# Patient Record
Sex: Male | Born: 1970 | Race: Black or African American | Hispanic: No | Smoking: Former smoker
Health system: Southern US, Community
[De-identification: ages and names within clinical notes are randomized; demographics above are authoritative.]

## PROBLEM LIST (undated history)

## (undated) DIAGNOSIS — E669 Obesity, unspecified: Secondary | ICD-10-CM

## (undated) DIAGNOSIS — I2510 Atherosclerotic heart disease of native coronary artery without angina pectoris: Secondary | ICD-10-CM

## (undated) DIAGNOSIS — I8290 Acute embolism and thrombosis of unspecified vein: Secondary | ICD-10-CM

## (undated) DIAGNOSIS — I10 Essential (primary) hypertension: Secondary | ICD-10-CM

## (undated) HISTORY — PX: CARDIAC CATHETERIZATION: SHX172

## (undated) HISTORY — DX: Obesity, unspecified: E66.9

## (undated) HISTORY — DX: Atherosclerotic heart disease of native coronary artery without angina pectoris: I25.10

## (undated) HISTORY — PX: APPENDECTOMY: SHX54

---

## 2009-09-24 ENCOUNTER — Emergency Department (HOSPITAL_BASED_OUTPATIENT_CLINIC_OR_DEPARTMENT_OTHER): Admission: EM | Admit: 2009-09-24 | Discharge: 2009-09-24 | Payer: Self-pay | Admitting: Emergency Medicine

## 2009-09-24 ENCOUNTER — Ambulatory Visit: Payer: Self-pay | Admitting: Diagnostic Radiology

## 2009-10-24 ENCOUNTER — Emergency Department (HOSPITAL_BASED_OUTPATIENT_CLINIC_OR_DEPARTMENT_OTHER): Admission: EM | Admit: 2009-10-24 | Discharge: 2009-10-25 | Payer: Self-pay | Admitting: Emergency Medicine

## 2009-10-24 ENCOUNTER — Ambulatory Visit: Payer: Self-pay | Admitting: Radiology

## 2011-03-28 LAB — URINALYSIS, ROUTINE W REFLEX MICROSCOPIC
Bilirubin Urine: NEGATIVE
Glucose, UA: NEGATIVE mg/dL
Hgb urine dipstick: NEGATIVE
Protein, ur: NEGATIVE mg/dL
Urobilinogen, UA: 1 mg/dL (ref 0.0–1.0)
pH: 5.5 (ref 5.0–8.0)

## 2011-10-29 ENCOUNTER — Emergency Department (HOSPITAL_BASED_OUTPATIENT_CLINIC_OR_DEPARTMENT_OTHER)
Admission: EM | Admit: 2011-10-29 | Discharge: 2011-10-29 | Disposition: A | Discharge status: Home / Self Care | Payer: Managed Care, Other (non HMO) | Attending: Emergency Medicine | Admitting: Emergency Medicine

## 2011-10-29 ENCOUNTER — Encounter: Payer: Self-pay | Admitting: *Deleted

## 2011-10-29 DIAGNOSIS — L299 Pruritus, unspecified: Secondary | ICD-10-CM | POA: Insufficient documentation

## 2011-10-29 NOTE — ED Provider Notes (Signed)
History     CSN: [id number] Arrival date & time: 10/29/2011  6:36 AM   First MD Initiated Contact with Patient 10/29/11 [phone]      Chief Complaint  Patient presents with  . Pruritis    (Consider location/radiation/quality/duration/timing/severity/associated sxs/prior treatment) The history is provided by the patient.  pt c/o itching all over for the past couple years. States has noticed if scratches self or itches a specific area that it will become more itchy. No acute or abrupt change in symptoms today. Denies rash or hives. No throat swelling or tightness. No wheezing. No change in symptoms related to foods, or changes in home or personal products. No recent medication use. No fever or chills. No recent febrile or viral illness. No hx liver or kidney disease.   History reviewed. No pertinent past medical history.  Past Surgical History  Procedure Date  . Cardiac catheterization   . Appendectomy     No family history on file.  History  Substance Use Topics  . Smoking status: Never Smoker   . Smokeless tobacco: Not on file  . Alcohol Use: Yes      Review of Systems  Constitutional: Negative for fever.  HENT: Negative for congestion.   Eyes: Negative for redness and itching.  Respiratory: Negative for shortness of breath.   Cardiovascular: Negative for chest pain and leg swelling.  Gastrointestinal: Negative for vomiting, abdominal pain and diarrhea.  Genitourinary: Negative for dysuria and discharge.  Musculoskeletal: Negative for back pain.  Skin: Negative for rash.  Neurological: Negative for headaches.  Hematological: Does not bruise/bleed easily.    Allergies  Review of patient's allergies indicates no known allergies.  Home Medications  No current outpatient prescriptions on file.  BP 115/84  Pulse 74  Temp(Src) 98.7 F (37.1 C) (Oral)  Ht 5\' 6"  (1.676 m)  Wt 223 lb (101.152 kg)  BMI 35.99 kg/m2  SpO2 99%  Physical Exam  Nursing note and vitals  reviewed. Constitutional: He is oriented to person, place, and time. He appears well-developed and well-nourished. No distress.  HENT:  Head: Atraumatic.  Mouth/Throat: Oropharynx is clear and moist.  Eyes: Pupils are equal, round, and reactive to light. No scleral icterus.  Neck: Neck supple. No tracheal deviation present.  Cardiovascular: Normal rate, regular rhythm and normal heart sounds.  Exam reveals no gallop and no friction rub.   No murmur heard. Pulmonary/Chest: Effort normal and breath sounds normal. No accessory muscle usage. No respiratory distress. He has no wheezes.  Abdominal: Soft. He exhibits no distension. There is no tenderness.       No hsm.   Musculoskeletal: Normal range of motion. He exhibits no edema.  Neurological: He is alert and oriented to person, place, and time.  Skin: Skin is warm and dry. No rash noted. No erythema. No pallor.       No rash or other skin lesions noted. No scabies noted.   Psychiatric: He has a normal mood and affect.    ED Course  Procedures (including critical care time)  Labs Reviewed - No data to display No results found.   No diagnosis found.    MDM  Discussed w pt, recommend trial benadryl as need for symptom relief.  Also discussed importance of primary care follow up. As ongoing symptoms for 1-2 yrs, pt inquires about referral to 'skin specialist' - will give referral to derm.          Suzi Roots, MD 10/29/11 [phone]

## 2011-10-29 NOTE — ED Notes (Signed)
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 Pt does not remember the treatment, and never f/u with a dermatologist.

## 2011-10-29 NOTE — ED Notes (Signed)
Intermittent generalized body itching for the past year. Worsened this past week. States when he scratches, the itching worsens. Also c/o suprapubic pains since last week. Denies urinary symptoms or penile discharge.

## 2013-11-03 ENCOUNTER — Encounter (HOSPITAL_BASED_OUTPATIENT_CLINIC_OR_DEPARTMENT_OTHER): Payer: Self-pay | Admitting: Emergency Medicine

## 2013-11-03 ENCOUNTER — Emergency Department (HOSPITAL_BASED_OUTPATIENT_CLINIC_OR_DEPARTMENT_OTHER)
Admission: EM | Admit: 2013-11-03 | Discharge: 2013-11-03 | Disposition: A | Discharge status: Home / Self Care | Payer: Managed Care, Other (non HMO) | Attending: Emergency Medicine | Admitting: Emergency Medicine

## 2013-11-03 DIAGNOSIS — Z9861 Coronary angioplasty status: Secondary | ICD-10-CM | POA: Insufficient documentation

## 2013-11-03 DIAGNOSIS — L299 Pruritus, unspecified: Secondary | ICD-10-CM | POA: Insufficient documentation

## 2013-11-03 NOTE — ED Notes (Signed)
Pt c/o itching and rash to neck and arms x 2 wks since new job in [employer].

## 2013-11-03 NOTE — ED Provider Notes (Signed)
CSN: [id number]     Arrival date & time 11/03/13  0907 History   First MD Initiated Contact with Patient 11/03/13 0915     Chief Complaint  Patient presents with  . Rash   (Consider location/radiation/quality/duration/timing/severity/associated sxs/prior Treatment) HPI Comments: 42 year old male presents with itching over the past 3 months. This coincided with starting a new job at a [employer] 3 months ago. He states he is similar problem back in 1997 and was advised to stop working there by doctor. 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 Mostly chemical [employer] used to make pain. Patient states she's noticed some redness as a rash mostly has pruritus. There's been no shortness of breath, sore throat, throat scratching, or vomiting. He states occasionally he gets some symptoms in size, most recently 2 weeks ago. He denies any current blurry vision, eye pain, eye itching, or discharge. He called the temp job service asking for new job, and they stated he needed a doctor to see him to verify that the itching is coming from the chemicals.   History reviewed. No pertinent past medical history. Past Surgical History  Procedure Laterality Date  . Cardiac catheterization    . Appendectomy     No family history on file. History  Substance Use Topics  . Smoking status: Never Smoker   . Smokeless tobacco: Not on file  . Alcohol Use: Yes    Review of Systems  Eyes: Negative for photophobia, pain, itching and visual disturbance.  Respiratory: Negative for cough.   All other systems reviewed and are negative.    Allergies  Review of patient's allergies indicates no known allergies.  Home Medications  No current outpatient prescriptions on file. BP 136/80  Pulse 73  Temp(Src) 98 F (36.7 C) (Oral)  Resp 18  SpO2 100% Physical Exam  Nursing note and vitals reviewed. Constitutional: He is oriented to person, place, and time. He  appears well-developed and well-nourished.  HENT:  Head: Normocephalic and atraumatic.  Right Ear: External ear normal.  Left Ear: External ear normal.  Nose: Nose normal.  Eyes: Right eye exhibits no discharge. Left eye exhibits no discharge.  Neck: Neck supple.  Cardiovascular: Normal rate, regular rhythm and normal heart sounds.   Pulmonary/Chest: Effort normal and breath sounds normal. He has no wheezes.  Abdominal: He exhibits no distension.  Neurological: He is alert and oriented to person, place, and time.  Skin: Skin is warm and dry.  Multiple lines of erythema, mostly located on the back are c/w scratching. No evidence of infection. No hives seen    ED Course  Procedures (including critical care time) Labs Review Labs Reviewed - No data to display Imaging Review No results found.  EKG Interpretation   None       MDM   1. Pruritic condition    Given the time correlation, his chemical closure is likely causing him to itch. There is no sign of any infection. There's no respiratory or GI symptoms. Will recommend by mouth Benadryl for symptomatic relief. I believe he will get full relief by taking away the exposure. Recommended that he follow she get a new job as this causing him significant distress.    Audree Camel, MD 11/03/13 [phone]

## 2013-11-17 ENCOUNTER — Emergency Department (HOSPITAL_BASED_OUTPATIENT_CLINIC_OR_DEPARTMENT_OTHER)
Admission: EM | Admit: 2013-11-17 | Discharge: 2013-11-17 | Disposition: A | Discharge status: Home / Self Care | Payer: Managed Care, Other (non HMO) | Attending: Emergency Medicine | Admitting: Emergency Medicine

## 2013-11-17 ENCOUNTER — Encounter (HOSPITAL_BASED_OUTPATIENT_CLINIC_OR_DEPARTMENT_OTHER): Payer: Self-pay | Admitting: Emergency Medicine

## 2013-11-17 DIAGNOSIS — K219 Gastro-esophageal reflux disease without esophagitis: Secondary | ICD-10-CM | POA: Insufficient documentation

## 2013-11-17 DIAGNOSIS — Z79899 Other long term (current) drug therapy: Secondary | ICD-10-CM | POA: Insufficient documentation

## 2013-11-17 DIAGNOSIS — Z9889 Other specified postprocedural states: Secondary | ICD-10-CM | POA: Insufficient documentation

## 2013-11-17 DIAGNOSIS — L600 Ingrowing nail: Secondary | ICD-10-CM | POA: Insufficient documentation

## 2013-11-17 MED ORDER — OMEPRAZOLE 20 MG PO CPDR: 20.0000 mg | DELAYED_RELEASE_CAPSULE | Freq: Two times a day (BID) | ORAL | Status: DC

## 2013-11-17 MED ORDER — CEPHALEXIN 500 MG PO CAPS: 500.0000 mg | ORAL_CAPSULE | Freq: Three times a day (TID) | ORAL | Status: DC

## 2013-11-17 MED ORDER — HYDROCODONE-ACETAMINOPHEN 5-325 MG PO TABS: 2.0000 | ORAL_TABLET | ORAL | Status: DC | PRN

## 2013-11-17 NOTE — ED Provider Notes (Addendum)
CSN: [id number]     Arrival date & time 11/17/13  4098 History   First MD Initiated Contact with Patient 11/17/13 [phone]     Chief Complaint  Patient presents with  . Gastrophageal Reflux  . Toe Pain   HPI  Patient presents to 2 complaints one is a sore right great toe.  Exam is an ingrown nail. Second complaint is that he relates that he feels like something is up in his chest or his stomach 3 days of chills or taste in his mouth. He is a feeling that food is tight when he swallows over the last several days as well. No fevers. No weight loss. No night sweats. No regurgitation. Normal bowel movements without blood.  History reviewed. No pertinent past medical history. Past Surgical History  Procedure Laterality Date  . Cardiac catheterization    . Appendectomy     No family history on file. History  Substance Use Topics  . Smoking status: Never Smoker   . Smokeless tobacco: Not on file  . Alcohol Use: Yes    Review of Systems  Constitutional: Negative for fever, chills, diaphoresis, appetite change and fatigue.  HENT: Positive for sore throat. Negative for mouth sores and trouble swallowing.   Eyes: Negative for visual disturbance.  Respiratory: Negative for cough, chest tightness, shortness of breath and wheezing.   Cardiovascular: Positive for chest pain.  Gastrointestinal: Negative for nausea, vomiting, abdominal pain, diarrhea and abdominal distention.  Endocrine: Negative for polydipsia, polyphagia and polyuria.  Genitourinary: Negative for dysuria, frequency and hematuria.  Musculoskeletal: Negative for gait problem.       Ingrown toenail  Skin: Negative for color change, pallor and rash.  Neurological: Negative for dizziness, syncope, light-headedness and headaches.  Hematological: Does not bruise/bleed easily.  Psychiatric/Behavioral: Negative for behavioral problems and confusion.    Allergies  Review of patient's allergies indicates no known allergies.  Home  Medications   Current Outpatient Rx  Name  Route  Sig  Dispense  Refill  . cephALEXin (KEFLEX) 500 MG capsule   Oral   Take 1 capsule (500 mg total) by mouth 3 (three) times daily.   21 capsule   0   . HYDROcodone-acetaminophen (NORCO/VICODIN) 5-325 MG per tablet   Oral   Take 2 tablets by mouth every 4 (four) hours as needed.   10 tablet   0   . omeprazole (PRILOSEC) 20 MG capsule   Oral   Take 1 capsule (20 mg total) by mouth 2 (two) times daily.   60 capsule   0    BP 145/82  Pulse 82  Temp(Src) 97.9 F (36.6 C) (Oral)  SpO2 98% Physical Exam  Constitutional: He is oriented to person, place, and time. He appears well-developed and well-nourished. No distress.  HENT:  Head: Normocephalic.  Normal exam. Oropharynx benign. No adenopathy in the neck.  Eyes: Conjunctivae are normal. Pupils are equal, round, and reactive to light. No scleral icterus.  Neck: Normal range of motion. Neck supple. No thyromegaly present.  Cardiovascular: Normal rate and regular rhythm.  Exam reveals no gallop and no friction rub.   No murmur heard. Pulmonary/Chest: Effort normal and breath sounds normal. No respiratory distress. He has no wheezes. He has no rales.  Abdominal: Soft. Bowel sounds are normal. He exhibits no distension. There is no tenderness. There is no rebound.  Musculoskeletal: Normal range of motion.  Neurological: He is alert and oriented to person, place, and time.  Skin: Skin is warm and  dry. No rash noted.  Right great toe medially shows a minimally somewhat ingrown. Is not fluctuant. Does not appear infected. We discussed removal and he is adamantly opposed  Psychiatric: He has a normal mood and affect. His behavior is normal.    ED Course  Procedures (including critical care time) Labs Review Labs Reviewed - No data to display Imaging Review No results found.  EKG Interpretation   None       MDM   1. GERD (gastroesophageal reflux disease)   2. Ingrown  toenail without infection     Great toe has an ingrown nail. It is not fluctuant or obviously infected. We discussed soaking elevating the nail versus excision of the nail. He is adamant he does not want the nail excised. He will soak the toe. He will jelly manipulate the nail. No straight not on a curve. Prescription for Keflex or try to prevent antibiotics.  His other symptoms are quite classic for reflux esophagitis. His normal exam. The proton pump inhibitors, avoid alcohol tobacco caffeine anti-inflammatories reflux precautions given  Roney Marion, MD 11/17/13 4098  Roney Marion, MD 11/17/13 [phone]

## 2013-11-17 NOTE — ED Notes (Signed)
Pt c/o "throat burning" when lying down, difficulty swallowing food at times x "over a week". Pt denies h/o same. Pt also c/o right great toe pain.

## 2013-11-27 ENCOUNTER — Emergency Department (HOSPITAL_BASED_OUTPATIENT_CLINIC_OR_DEPARTMENT_OTHER)
Admission: EM | Admit: 2013-11-27 | Discharge: 2013-11-27 | Disposition: A | Discharge status: Home / Self Care | Payer: Managed Care, Other (non HMO) | Attending: Emergency Medicine | Admitting: Emergency Medicine

## 2013-11-27 ENCOUNTER — Encounter (HOSPITAL_BASED_OUTPATIENT_CLINIC_OR_DEPARTMENT_OTHER): Payer: Self-pay | Admitting: Emergency Medicine

## 2013-11-27 DIAGNOSIS — Z79899 Other long term (current) drug therapy: Secondary | ICD-10-CM | POA: Insufficient documentation

## 2013-11-27 DIAGNOSIS — Z9861 Coronary angioplasty status: Secondary | ICD-10-CM | POA: Insufficient documentation

## 2013-11-27 DIAGNOSIS — B029 Zoster without complications: Secondary | ICD-10-CM | POA: Insufficient documentation

## 2013-11-27 DIAGNOSIS — Z792 Long term (current) use of antibiotics: Secondary | ICD-10-CM | POA: Insufficient documentation

## 2013-11-27 MED ORDER — OXYCODONE-ACETAMINOPHEN 5-325 MG PO TABS: 1.0000 | ORAL_TABLET | ORAL | Status: DC | PRN

## 2013-11-27 MED ORDER — VALACYCLOVIR HCL 1 G PO TABS: 1000.0000 mg | ORAL_TABLET | Freq: Three times a day (TID) | ORAL | Status: DC

## 2013-11-27 NOTE — ED Notes (Cosign Needed Addendum)
Ha to top of head onset 4 days ago,  Denies inj,  No relief from ibu  Pt states top of head painful to touch and feels raw

## 2013-11-27 NOTE — ED Notes (Signed)
Pt reports headachje x 4 days top of head feels raw and painful to touch Aleve ineffective

## 2013-11-27 NOTE — ED Provider Notes (Signed)
CSN: [id number]     Arrival date & time 11/27/13  1905 History   First MD Initiated Contact with Patient 11/27/13 2019     Chief Complaint  Patient presents with  . Headache   (Consider location/radiation/quality/duration/timing/severity/associated sxs/prior Treatment) Patient is a 42 y.o. male presenting with headaches. The history is provided by the patient.  Headache Pain location:  Occipital Quality:  Sharp Radiates to: to the right temporal area. Severity currently:  8/10 Duration:  4 days Timing:  Constant Progression:  Worsening Chronicity:  New Similar to prior headaches: no   Relieved by:  Nothing Worsened by:  Nothing tried Ineffective treatments:  NSAIDs  Rizwan Kuyper is a 42 y.o. male who presents to the ED with pain on the right side of his head that he describes as burning and severe. He states that pain is so bad that he can't stand for his shirt to touch his head when he pulls it over his head. Has never had pain like this before. The pain started on the side of his head and now radiates to the temporal area. He has taken ibuprofen for pain but no relief. He denies any other problems.   History reviewed. No pertinent past medical history. Past Surgical History  Procedure Laterality Date  . Cardiac catheterization    . Appendectomy     History reviewed. No pertinent family history. History  Substance Use Topics  . Smoking status: Never Smoker   . Smokeless tobacco: Not on file  . Alcohol Use: Yes    Review of Systems  As stated IN HPI  Allergies  Review of patient's allergies indicates no known allergies.  Home Medications   Current Outpatient Rx  Name  Route  Sig  Dispense  Refill  . cephALEXin (KEFLEX) 500 MG capsule   Oral   Take 1 capsule (500 mg total) by mouth 3 (three) times daily.   21 capsule   0   . HYDROcodone-acetaminophen (NORCO/VICODIN) 5-325 MG per tablet   Oral   Take 2 tablets by mouth every 4 (four) hours as needed.   10  tablet   0   . omeprazole (PRILOSEC) 20 MG capsule   Oral   Take 1 capsule (20 mg total) by mouth 2 (two) times daily.   60 capsule   0    BP 140/89  Pulse 87  Temp(Src) 98.4 F (36.9 C) (Oral)  Resp 18  Ht 5\' 6"  (1.676 m)  Wt 245 lb (111.131 kg)  BMI 39.56 kg/m2  SpO2 98% Physical Exam  Nursing note and vitals reviewed. Constitutional: He is oriented to person, place, and time. He appears well-developed and well-nourished.  HENT:  Head: Atraumatic.    Mouth/Throat: Uvula is midline, oropharynx is clear and moist and mucous membranes are normal.  Tender with palpation of right side of head with radiation to the right temporal area.   Eyes: EOM are normal.  Neck: Neck supple.  Cardiovascular: Normal rate and regular rhythm.   Pulmonary/Chest: Effort normal and breath sounds normal.  Abdominal: Soft. There is no tenderness.  Musculoskeletal: Normal range of motion.  Neurological: He is alert and oriented to person, place, and time. No cranial nerve deficit.  Skin: Skin is warm and dry.  Psychiatric: He has a normal mood and affect. His behavior is normal.    ED Course: Dr. Fayrene Fearing in to examine the patient. Will treat for zoster.  Procedures    MDM  42 y.o. male with pain over the  right side of the scalp radiating to the right temporal area. Will treat for zoster. Patient stable for discharge without any immediate complications. Discussed with the patient clinical findings and plan of care. All questioned fully answered. He will returne if any problems arise.    Medication List    TAKE these medications       oxyCODONE-acetaminophen 5-325 MG per tablet  Commonly known as:  ROXICET  Take 1 tablet by mouth every 4 (four) hours as needed for severe pain.     valACYclovir 1000 MG tablet  Commonly known as:  VALTREX  Take 1 tablet (1,000 mg total) by mouth 3 (three) times daily.      ASK your doctor about these medications       cephALEXin 500 MG capsule  Commonly  known as:  KEFLEX  Take 1 capsule (500 mg total) by mouth 3 (three) times daily.     HYDROcodone-acetaminophen 5-325 MG per tablet  Commonly known as:  NORCO/VICODIN  Take 2 tablets by mouth every 4 (four) hours as needed.     omeprazole 20 MG capsule  Commonly known as:  PRILOSEC  Take 1 capsule (20 mg total) by mouth 2 (two) times daily.           [facility] Orlene Och, [place] 11/28/13 [phone]

## 2013-12-06 NOTE — ED Provider Notes (Signed)
Medical screening examination/treatment/procedure(s) were performed by non-physician practitioner and as supervising physician I was immediately available for consultation/collaboration.  EKG Interpretation   None         Evonte Prestage J Naama Sappington, MD 12/06/13 2313 

## 2014-07-25 ENCOUNTER — Encounter (HOSPITAL_BASED_OUTPATIENT_CLINIC_OR_DEPARTMENT_OTHER): Payer: Self-pay | Admitting: Emergency Medicine

## 2014-07-25 ENCOUNTER — Emergency Department (HOSPITAL_BASED_OUTPATIENT_CLINIC_OR_DEPARTMENT_OTHER)
Admission: EM | Admit: 2014-07-25 | Discharge: 2014-07-25 | Disposition: A | Discharge status: Home / Self Care | Payer: Managed Care, Other (non HMO) | Attending: Emergency Medicine | Admitting: Emergency Medicine

## 2014-07-25 ENCOUNTER — Emergency Department (HOSPITAL_BASED_OUTPATIENT_CLINIC_OR_DEPARTMENT_OTHER): Payer: Managed Care, Other (non HMO)

## 2014-07-25 DIAGNOSIS — Z79899 Other long term (current) drug therapy: Secondary | ICD-10-CM | POA: Insufficient documentation

## 2014-07-25 DIAGNOSIS — M7989 Other specified soft tissue disorders: Secondary | ICD-10-CM | POA: Insufficient documentation

## 2014-07-25 DIAGNOSIS — Z86718 Personal history of other venous thrombosis and embolism: Secondary | ICD-10-CM | POA: Insufficient documentation

## 2014-07-25 HISTORY — DX: Acute embolism and thrombosis of unspecified vein: I82.90

## 2014-07-25 NOTE — ED Notes (Signed)
Patient transported to Ultrasound 

## 2014-07-25 NOTE — ED Notes (Signed)
Recent ly told had a c;ot in  Left side of neck anf rt arm.  Was put on Xarelto    Still has swelling of hands  Esp abdominal tenderness night and is concerned he might still have a clot

## 2014-07-25 NOTE — ED Provider Notes (Signed)
This chart was scribed for Layla Maw Ward, DO by Modena Jansky, ED Scribe. This patient was seen in room MH05/MH05 and the patient's care was started at 5:02 PM.  TIME SEEN: 5:02 PM  CHIEF COMPLAINT: Hand Swelling  HPI:  Danny Espinoza is a 43 y.o. 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 He reports that he went to the ED 2 months ago and was told he has a blood clot and was put on Xarelto. He states that his swelling has been getting worse lately to the point where he can't bend his fingers. He denies being on dialysis or recent IVs or other procedures to his upper extremities or neck. He denies any persistent chest pain, SOB, or leg swelling. States he is concerned because the swelling is getting worse instead of better. No history of injury. No numbness, tingling or focal weakness.   ROS: See HPI Constitutional: no fever  Eyes: no drainage  ENT: no runny nose   Cardiovascular:  no chest pain  Resp: no SOB  GI: no vomiting GU: no dysuria Integumentary: no rash  Allergy: no hives  Musculoskeletal: no leg swelling  Neurological: no slurred speech ROS otherwise negative  PAST MEDICAL HISTORY/PAST SURGICAL HISTORY:  Past Medical History  Diagnosis Date  . Thrombosis     MEDICATIONS:  Prior to Admission medications   Medication Sig Start Date End Date Taking? Authorizing Provider  cephALEXin (KEFLEX) 500 MG capsule Take 1 capsule (500 mg total) by mouth 3 (three) times daily. 11/17/13   Rolland Porter, MD  HYDROcodone-acetaminophen (NORCO/VICODIN) 5-325 MG per tablet Take 2 tablets by mouth every 4 (four) hours as needed. 11/17/13   Rolland Porter, MD  omeprazole (PRILOSEC) 20 MG capsule Take 1 capsule (20 mg total) by mouth 2 (two) times daily. 11/17/13   Rolland Porter, MD  oxyCODONE-acetaminophen (ROXICET) 5-325 MG per tablet Take 1  tablet by mouth every 4 (four) hours as needed for severe pain. 11/27/13   Hope Orlene Och, NP  valACYclovir (VALTREX) 1000 MG tablet Take 1 tablet (1,000 mg total) by mouth 3 (three) times daily. 11/27/13   Hope Orlene Och, NP    ALLERGIES:  No Known Allergies  SOCIAL HISTORY:  History  Substance Use Topics  . Smoking status: Never Smoker   . Smokeless tobacco: Not on file  . Alcohol Use: Yes    FAMILY HISTORY: No family history on file.  EXAM: BP 124/83  Pulse 78  Temp(Src) 97.9 F (36.6 C) (Oral)  Resp 18  Ht 5\' 7"  (1.702 m)  Wt 235 lb (106.595 kg)  BMI 36.80 kg/m2  SpO2 100% CONSTITUTIONAL: Alert and oriented and responds appropriately to questions. Well-appearing; well-nourished HEAD: Normocephalic EYES: Conjunctivae clear, PERRL ENT: normal nose; no rhinorrhea; moist mucous membranes; pharynx without lesions noted NECK: Supple, no meningismus, no LAD  CARD: RRR; S1 and S2 appreciated; no murmurs, no clicks, no rubs, no gallops RESP: Normal chest excursion without splinting or tachypnea; breath sounds clear and equal bilaterally; no wheezes, no rhonchi, no rales,  ABD/GI: Normal bowel sounds; non-distended; soft, non-tender, no rebound, no guarding BACK:  The back appears normal and is non-tender to palpation, there is no CVA tenderness EXT: Swelling. +2 DP pulse bilaterally. No appreciable swelling of his upper extremity her hands, sensation to light touch intact diffusely. No bony deformity. No erythema or  warmth. Compartments are soft. Normal ROM in all joints; non-tender to palpation; no edema; normal capillary refill; no cyanosis    SKIN: Normal color for age and race; warm NEURO: Moves all extremities equally PSYCH: The patient's mood and manner are appropriate. Grooming and personal hygiene are appropriate.  MEDICAL DECISION MAKING: Patient have persistent swelling and pain in his upper extremities he states is getting worse instead of better. He has been on Xarelto for  2 months without relief. No other associated symptoms. His exam is benign currently. We'll repeat an ultrasound of his bilateral upper extremities to evaluate for persistent clot. No sign of infection on exam. No history of injury.  ED PROGRESS: Ultrasound shows no sign of DVT. I feel patient is safe to stop taking Xarelto as he is been on it for over 2 months. 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 Discussed elevation, using compression sleeves as needed. Discussed strict return precautions. Patient verbalizes understanding and is comfortable with plan.   I personally performed the services described in this documentation, which was scribed in my presence. The recorded information has been reviewed and is accurate.      Layla MawKristen N Ward, DO 07/25/14 2135

## 2014-07-25 NOTE — Discharge Instructions (Signed)

## 2014-07-25 NOTE — ED Notes (Signed)
Put for ultrasound/xray, this is not for the Secretaries.  Let the Nurse know about it.

## 2014-07-25 NOTE — ED Notes (Signed)
Pt returned from u/s, no needs at this time, warm blanket provided

## 2016-08-14 ENCOUNTER — Ambulatory Visit: Payer: Self-pay | Admitting: Allergy and Immunology

## 2016-10-26 ENCOUNTER — Encounter (HOSPITAL_BASED_OUTPATIENT_CLINIC_OR_DEPARTMENT_OTHER): Payer: Self-pay | Admitting: Emergency Medicine

## 2016-10-26 ENCOUNTER — Emergency Department (HOSPITAL_BASED_OUTPATIENT_CLINIC_OR_DEPARTMENT_OTHER)
Admission: EM | Admit: 2016-10-26 | Discharge: 2016-10-26 | Disposition: A | Discharge status: Home / Self Care | Payer: BLUE CROSS/BLUE SHIELD | Attending: Emergency Medicine | Admitting: Emergency Medicine

## 2016-10-26 DIAGNOSIS — R519 Headache, unspecified: Secondary | ICD-10-CM

## 2016-10-26 DIAGNOSIS — R51 Headache: Secondary | ICD-10-CM | POA: Diagnosis not present

## 2016-10-26 MED ORDER — KETOROLAC TROMETHAMINE 30 MG/ML IJ SOLN
30.0000 mg | Freq: Once | INTRAMUSCULAR | Status: AC
  Administered 2016-10-26: 30 mg via INTRAMUSCULAR
  Filled 2016-10-26: qty 1

## 2016-10-26 NOTE — ED Triage Notes (Signed)
Pt reports HA that started at 2300 last night. Pt denies any n/v. Pt states he has had an URI for a few days.

## 2016-10-26 NOTE — ED Provider Notes (Signed)
   [facility] Provider Note: Danny DellJ. Lane Rafferty Postlewait, MD, FACEP  CSN: [id number] MRN: [id number] ARRIVAL: 10/26/16 at 0330 ROOM: MH06/MH06   CHIEF COMPLAINT  Headache   HISTORY OF PRESENT ILLNESS  Danny Espinoza is a 45 y.o. male who complains of a headache that began yesterday evening about 11 PM. The onset was gradual. It is located frontally. It is throbbing in nature. It is rated as moderate to severe. It is worse with cough. He took Aleve without relief. He denies photophobia, nausea or vomiting. 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 He has had a URI this week characterized primarily as coughing. He denies nasal congestion or rhinorrhea.   Past Medical History:  Diagnosis Date  . Thrombosis     Past Surgical History:  Procedure Laterality Date  . APPENDECTOMY    . CARDIAC CATHETERIZATION      No family history on file.  Social History  Substance Use Topics  . Smoking status: Never Smoker  . Smokeless tobacco: Never Used  . Alcohol use Yes    Prior to Admission medications   Medication Sig Start Date End Date Taking? Authorizing Provider  rivaroxaban (XARELTO) 20 MG TABS tablet Take 20 mg by mouth daily with supper.    Historical Provider, MD    Allergies Review of patient's allergies indicates no known allergies.   REVIEW OF SYSTEMS  Negative except as noted here or in the History of Present Illness.   PHYSICAL EXAMINATION  Initial Vital Signs Blood pressure 137/93, pulse 76, temperature 98 F (36.7 C), temperature source Oral, resp. rate 18, height 5\' 6"  (1.676 m), weight 219 lb (99.3 kg), SpO2 96 %.  Examination General: Well-developed, well-nourished male in no acute distress; appearance consistent with age of record HENT: normocephalic; atraumatic; no fundal tenderness to percussion Eyes: pupils equal, round and reactive to light; extraocular muscles intact Neck: supple Heart: regular rate and  rhythm Lungs: clear to auscultation bilaterally Abdomen: soft; nondistended; nontender; no masses or hepatosplenomegaly; bowel sounds present Extremities: No deformity; full range of motion; pulses normal Neurologic: Awake, alert and oriented; motor function intact in all extremities and symmetric; no facial droop; negative Romberg; normal finger to nose; normal coordination and speech Skin: Warm and dry Psychiatric: Normal mood and affect   RESULTS  Summary of this visit's results, reviewed by myself:   EKG Interpretation  Date/Time:    Ventricular Rate:    PR Interval:    QRS Duration:   QT Interval:    QTC Calculation:   R Axis:     Text Interpretation:        Laboratory Studies: No results found for this or any previous visit (from the past 24 hour(s)). Imaging Studies: No results found.  ED COURSE  Nursing notes and initial vitals signs, including pulse oximetry, reviewed.  Vitals:   10/26/16 0336  BP: 137/93  Pulse: 76  Resp: 18  Temp: 98 F (36.7 C)  TempSrc: Oral  SpO2: 96%  Weight: 219 lb (99.3 kg)  Height: 5\' 6"  (1.676 m)   5:32 AM Pain significantly improved after IM Toradol.  PROCEDURES    ED DIAGNOSES     ICD-9-CM ICD-10-CM   1. Bad headache 784.0 R51        Paula LibraJohn Zoriana Oats, MD 10/26/16 0532

## 2017-06-25 ENCOUNTER — Emergency Department (HOSPITAL_BASED_OUTPATIENT_CLINIC_OR_DEPARTMENT_OTHER)
Admission: EM | Admit: 2017-06-25 | Discharge: 2017-06-25 | Disposition: A | Discharge status: Home / Self Care | Payer: BLUE CROSS/BLUE SHIELD | Attending: Emergency Medicine | Admitting: Emergency Medicine

## 2017-06-25 DIAGNOSIS — Y929 Unspecified place or not applicable: Secondary | ICD-10-CM | POA: Insufficient documentation

## 2017-06-25 DIAGNOSIS — M5432 Sciatica, left side: Secondary | ICD-10-CM

## 2017-06-25 DIAGNOSIS — S39012A Strain of muscle, fascia and tendon of lower back, initial encounter: Secondary | ICD-10-CM | POA: Diagnosis not present

## 2017-06-25 DIAGNOSIS — M549 Dorsalgia, unspecified: Secondary | ICD-10-CM | POA: Diagnosis present

## 2017-06-25 DIAGNOSIS — Y33XXXA Other specified events, undetermined intent, initial encounter: Secondary | ICD-10-CM | POA: Diagnosis not present

## 2017-06-25 DIAGNOSIS — Y999 Unspecified external cause status: Secondary | ICD-10-CM | POA: Insufficient documentation

## 2017-06-25 DIAGNOSIS — Y939 Activity, unspecified: Secondary | ICD-10-CM | POA: Diagnosis not present

## 2017-06-25 LAB — URINALYSIS, ROUTINE W REFLEX MICROSCOPIC
BILIRUBIN URINE: NEGATIVE
Glucose, UA: NEGATIVE mg/dL
Hgb urine dipstick: NEGATIVE
Ketones, ur: NEGATIVE mg/dL
LEUKOCYTES UA: NEGATIVE
NITRITE: NEGATIVE
PH: 6 (ref 5.0–8.0)
Protein, ur: NEGATIVE mg/dL
SPECIFIC GRAVITY, URINE: 1.02 (ref 1.005–1.030)

## 2017-06-25 LAB — D-DIMER, QUANTITATIVE: D-Dimer, Quant: <0.27 ug/mL-FEU (ref 0.00–0.50)

## 2017-06-25 MED ORDER — KETOROLAC TROMETHAMINE 30 MG/ML IJ SOLN
30.0000 mg | Freq: Once | INTRAMUSCULAR | Status: AC
  Administered 2017-06-25: 30 mg via INTRAVENOUS

## 2017-06-25 MED ORDER — CYCLOBENZAPRINE HCL 10 MG PO TABS: 10.0000 mg | ORAL_TABLET | Freq: Two times a day (BID) | ORAL | 0 refills | Status: DC | PRN

## 2017-06-25 MED ORDER — KETOROLAC TROMETHAMINE 30 MG/ML IJ SOLN
30.0000 mg | Freq: Once | INTRAMUSCULAR | Status: DC
  Filled 2017-06-25: qty 1

## 2017-06-25 MED ORDER — KETOROLAC TROMETHAMINE 10 MG PO TABS: 10.0000 mg | ORAL_TABLET | Freq: Four times a day (QID) | ORAL | 0 refills | Status: DC | PRN

## 2017-06-25 MED ORDER — CYCLOBENZAPRINE HCL 10 MG PO TABS
10.0000 mg | ORAL_TABLET | Freq: Two times a day (BID) | ORAL | 0 refills | Status: DC | PRN
Start: 2017-06-25 — End: 2021-04-24

## 2017-06-25 NOTE — ED Provider Notes (Signed)
[facility] Provider Note   CSN: [id number] Arrival date & time: 06/25/17  1017     History   Chief Complaint Chief Complaint  Patient presents with  . Back Pain  . Leg Pain    HPI Danny Espinoza is a 46 y.o. male.  Pt presents to the ED today with left sided back pain and left calf pain.  Pain has been going on for 2 weeks.  He has not taken any otc meds for pain.  The pt said he lifts weights occasionally, but has not done anything unusual.  Pt denies any numbness or tingling to his left foot.      Past Medical History:  Diagnosis Date  . Thrombosis     There are no active problems to display for this patient.   Past Surgical History:  Procedure Laterality Date  . APPENDECTOMY    . CARDIAC CATHETERIZATION         Home Medications    Prior to Admission medications   Medication Sig Start Date End Date Taking? Authorizing Provider  cyclobenzaprine (FLEXERIL) 10 MG tablet Take 1 tablet (10 mg total) by mouth 2 (two) times daily as needed for muscle spasms. 06/25/17   Jacalyn Lefevre, MD  ketorolac (TORADOL) 10 MG tablet Take 1 tablet (10 mg total) by mouth every 6 (six) hours as needed. 06/25/17   Jacalyn Lefevre, MD    Family History No family history on file.  Social History Social History  Substance Use Topics  . Smoking status: Never Smoker  . Smokeless tobacco: Never Used  . Alcohol use Yes     Allergies   Patient has no known allergies.   Review of Systems Review of Systems  Musculoskeletal: Positive for back pain.       Left leg pain  All other systems reviewed and are negative.    Physical Exam Updated Vital Signs BP (!) 145/97 (BP Location: Left Arm)   Pulse 77   Temp 98.5 F (36.9 C) (Oral)   Resp 20   Ht 5\' 6"  (1.676 m)   Wt 108 kg (238 lb)   SpO2 99%   BMI 38.41 kg/m   Physical Exam  Constitutional: He is oriented to person, place, and time. He appears well-developed and well-nourished.  HENT:  Head:  Normocephalic and atraumatic.  Right Ear: External ear normal.  Left Ear: External ear normal.  Nose: Nose normal.  Mouth/Throat: Oropharynx is clear and moist.  Eyes: Conjunctivae and EOM are normal. Pupils are equal, round, and reactive to light.  Neck: Normal range of motion. Neck supple.  Cardiovascular: Normal rate, regular rhythm, normal heart sounds and intact distal pulses.   Pulmonary/Chest: Effort normal and breath sounds normal.  Abdominal: Soft. Bowel sounds are normal.  Musculoskeletal:  Left calf tenderness  Neurological: He is alert and oriented to person, place, and time.  Skin: Skin is warm.  Psychiatric: He has a normal mood and affect.  Nursing note and vitals reviewed.    ED Treatments / Results  Labs (all labs ordered are listed, but only abnormal results are displayed) Labs Reviewed  D-DIMER, QUANTITATIVE (NOT AT [facility])  URINALYSIS, ROUTINE W REFLEX MICROSCOPIC    EKG  EKG Interpretation None       Radiology No results found.  Procedures Procedures (including critical care time)  Medications Ordered in ED Medications  ketorolac (TORADOL) 30 MG/ML injection 30 mg (30 mg Intravenous Given 06/25/17 1055)     Initial Impression / Assessment and  Plan / ED Course  I have reviewed the triage vital signs and the nursing notes.  Pertinent labs & imaging results that were available during my care of the patient were reviewed by me and considered in my medical decision making (see chart for details).     Pt is feeling much better.  Sx are likely muscular.  Pt knows to return if worse.  Final Clinical Impressions(s) / ED Diagnoses   Final diagnoses:  Sciatica of left side  Strain of lumbar region, initial encounter    New Prescriptions Current Discharge Medication List    START taking these medications   Details  cyclobenzaprine (FLEXERIL) 10 MG tablet Take 1 tablet (10 mg total) by mouth 2 (two) times daily as needed for muscle spasms. Qty:  20 tablet, Refills: 0    ketorolac (TORADOL) 10 MG tablet Take 1 tablet (10 mg total) by mouth every 6 (six) hours as needed. Qty: 10 tablet, Refills: 0         Jacalyn LefevreHaviland, Shaneil Yazdi, MD 06/25/17 1217

## 2017-06-25 NOTE — ED Triage Notes (Signed)
Pt c/o lower left back/flank pain for past 2 week.  Describes pain as a constant ache, worse with positioning, that hasn't gotten better or worse over time.  Pt c/o left posterior calf pain that started 2 days ago, worse with weight bearing and active extension of left ankle.

## 2021-02-05 ENCOUNTER — Emergency Department (HOSPITAL_BASED_OUTPATIENT_CLINIC_OR_DEPARTMENT_OTHER)
Admission: EM | Admit: 2021-02-05 | Discharge: 2021-02-05 | Disposition: A | Discharge status: Home / Self Care | Payer: BC Managed Care – PPO | Attending: Emergency Medicine | Admitting: Emergency Medicine

## 2021-02-05 ENCOUNTER — Encounter (HOSPITAL_BASED_OUTPATIENT_CLINIC_OR_DEPARTMENT_OTHER): Payer: Self-pay | Admitting: Emergency Medicine

## 2021-02-05 ENCOUNTER — Other Ambulatory Visit: Payer: Self-pay

## 2021-02-05 DIAGNOSIS — Z20822 Contact with and (suspected) exposure to covid-19: Secondary | ICD-10-CM | POA: Insufficient documentation

## 2021-02-05 DIAGNOSIS — R197 Diarrhea, unspecified: Secondary | ICD-10-CM | POA: Insufficient documentation

## 2021-02-05 LAB — CBC WITH DIFFERENTIAL/PLATELET
Abs Immature Granulocytes: 0.05 10*3/uL (ref 0.00–0.07)
Basophils Absolute: 0 10*3/uL (ref 0.0–0.1)
Basophils Relative: 0 %
Eosinophils Absolute: 0.1 10*3/uL (ref 0.0–0.5)
Eosinophils Relative: 2 %
HCT: 39.8 % (ref 39.0–52.0)
Hemoglobin: 12.9 g/dL — ABNORMAL LOW (ref 13.0–17.0)
Immature Granulocytes: 1 %
Lymphocytes Relative: 41 %
Lymphs Abs: 2.4 10*3/uL (ref 0.7–4.0)
MCH: 27.9 pg (ref 26.0–34.0)
MCHC: 32.4 g/dL (ref 30.0–36.0)
MCV: 86 fL (ref 80.0–100.0)
Monocytes Absolute: 0.4 10*3/uL (ref 0.1–1.0)
Monocytes Relative: 8 %
Neutro Abs: 2.7 10*3/uL (ref 1.7–7.7)
Neutrophils Relative %: 48 %
Platelets: 301 10*3/uL (ref 150–400)
RBC: 4.63 MIL/uL (ref 4.22–5.81)
RDW: 13.1 % (ref 11.5–15.5)
Smear Review: NORMAL
WBC: 5.7 10*3/uL (ref 4.0–10.5)
nRBC: 0 % (ref 0.0–0.2)

## 2021-02-05 LAB — BASIC METABOLIC PANEL
Anion gap: 10 (ref 5–15)
BUN: 9 mg/dL (ref 6–20)
CO2: 24 mmol/L (ref 22–32)
Calcium: 8.5 mg/dL — ABNORMAL LOW (ref 8.9–10.3)
Chloride: 104 mmol/L (ref 98–111)
Creatinine, Ser: 0.9 mg/dL (ref 0.61–1.24)
GFR, Estimated: >60 mL/min (ref >60)
Glucose, Bld: 149 mg/dL — ABNORMAL HIGH (ref 70–99)
Potassium: 3 mmol/L — ABNORMAL LOW (ref 3.5–5.1)
Sodium: 138 mmol/L (ref 135–145)

## 2021-02-05 LAB — SARS CORONAVIRUS 2 (TAT 6-24 HRS): SARS Coronavirus 2: NEGATIVE

## 2021-02-05 MED ORDER — SODIUM CHLORIDE 0.9 % IV BOLUS
1000.0000 mL | Freq: Once | INTRAVENOUS | Status: AC
  Administered 2021-02-05: 1000 mL via INTRAVENOUS

## 2021-02-05 MED ORDER — LOPERAMIDE HCL 2 MG PO CAPS
4.0000 mg | ORAL_CAPSULE | Freq: Once | ORAL | Status: AC
  Administered 2021-02-05: 4 mg via ORAL
  Filled 2021-02-05: qty 2

## 2021-02-05 MED ORDER — POTASSIUM CHLORIDE CRYS ER 20 MEQ PO TBCR
40.0000 meq | EXTENDED_RELEASE_TABLET | Freq: Once | ORAL | Status: AC
  Administered 2021-02-05: 40 meq via ORAL
  Filled 2021-02-05: qty 2

## 2021-02-05 MED ORDER — POTASSIUM CHLORIDE ER 10 MEQ PO TBCR: 20.0000 meq | EXTENDED_RELEASE_TABLET | Freq: Every day | ORAL | 0 refills | Status: DC

## 2021-02-05 MED ORDER — LOPERAMIDE HCL 2 MG PO CAPS: 2.0000 mg | ORAL_CAPSULE | Freq: Four times a day (QID) | ORAL | 0 refills | Status: DC | PRN

## 2021-02-05 NOTE — ED Provider Notes (Signed)
[facility] EMERGENCY DEPARTMENT Provider Note   CSN: [id number] Arrival date & time: 02/05/21  0932     History Chief Complaint  Patient presents with  . Diarrhea    Danny Espinoza is a 50 y.o. male.  Patient with complaint of diarrhea for 1 week.  Describes it as loose watery brown stool.  Has been going on about 7-10 times a day for the past week.  Denies any recent antibiotic use or recent travel.  Denies any fevers or cough.  No vomiting.  No abdominal pain.        Past Medical History:  Diagnosis Date  . Thrombosis     There are no problems to display for this patient.   Past Surgical History:  Procedure Laterality Date  . APPENDECTOMY    . CARDIAC CATHETERIZATION         No family history on file.  Social History   Tobacco Use  . Smoking status: Never Smoker  . Smokeless tobacco: Never Used  Substance Use Topics  . Alcohol use: Yes  . Drug use: No    Home Medications Prior to Admission medications   Medication Sig Start Date End Date Taking? Authorizing Provider  loperamide (IMODIUM) 2 MG capsule Take 1 capsule (2 mg total) by mouth 4 (four) times daily as needed for diarrhea or loose stools. 02/05/21  Yes Keshayla Schrum, Eustace Moore, MD  potassium chloride (KLOR-CON) 10 MEQ tablet Take 2 tablets (20 mEq total) by mouth daily for 3 days. 02/05/21 02/08/21 Yes Caralina Nop, Eustace Moore, MD  cyclobenzaprine (FLEXERIL) 10 MG tablet Take 1 tablet (10 mg total) by mouth 2 (two) times daily as needed for muscle spasms. 06/25/17   Jacalyn Lefevre, MD  ketorolac (TORADOL) 10 MG tablet Take 1 tablet (10 mg total) by mouth every 6 (six) hours as needed. 06/25/17   Jacalyn Lefevre, MD    Allergies    Patient has no known allergies.  Review of Systems   Review of Systems  Constitutional: Negative for fever.  HENT: Negative for ear pain and sore throat.   Eyes: Negative for pain.  Respiratory: Negative for cough.   Cardiovascular: Negative for chest pain.  Gastrointestinal:  Negative for abdominal pain.  Genitourinary: Negative for flank pain.  Musculoskeletal: Negative for back pain.  Skin: Negative for color change and rash.  Neurological: Negative for syncope.  All other systems reviewed and are negative.   Physical Exam Updated Vital Signs BP (!) 142/98   Pulse 78   Temp 98.2 F (36.8 C) (Oral)   Resp 19   Ht 5\' 6"  (1.676 m)   Wt 108.9 kg   SpO2 95%   BMI 38.74 kg/m   Physical Exam Constitutional:      General: He is not in acute distress.    Appearance: He is well-developed.  HENT:     Head: Normocephalic.     Nose: Nose normal.  Eyes:     Extraocular Movements: Extraocular movements intact.  Cardiovascular:     Rate and Rhythm: Normal rate.  Pulmonary:     Effort: Pulmonary effort is normal.  Abdominal:     General: There is no distension.     Tenderness: There is no abdominal tenderness. There is no guarding.  Skin:    Coloration: Skin is not jaundiced.  Neurological:     Mental Status: He is alert. Mental status is at baseline.     ED Results / Procedures / Treatments   Labs (all labs ordered  are listed, but only abnormal results are displayed) Labs Reviewed  BASIC METABOLIC PANEL - Abnormal; Notable for the following components:      Result Value   Potassium 3.0 (*)    Glucose, Bld 149 (*)    Calcium 8.5 (*)    All other components within normal limits  CBC WITH DIFFERENTIAL/PLATELET - Abnormal; Notable for the following components:   Hemoglobin 12.9 (*)    All other components within normal limits  C DIFFICILE QUICK SCREEN W PCR REFLEX  SARS CORONAVIRUS 2 (TAT 6-24 HRS)    EKG None  Radiology No results found.  Procedures Procedures   Medications Ordered in ED Medications  loperamide (IMODIUM) capsule 4 mg (4 mg Oral Given 02/05/21 1003)  sodium chloride 0.9 % bolus 1,000 mL (0 mLs Intravenous Stopped 02/05/21 1121)  potassium chloride SA (KLOR-CON) CR tablet 40 mEq (40 mEq Oral Given 02/05/21 1057)     ED Course  I have reviewed the triage vital signs and the nursing notes.  Pertinent labs & imaging results that were available during my care of the patient were reviewed by me and considered in my medical decision making (see chart for details).    MDM Rules/Calculators/A&P                          Clinical exam is benign with no guarding or tenderness of the abdominal region.  Labs are sent, IV fluid hydration provided.  Imodium provided as well.  Stool sample requested for analysis.  Given oral repletion here.  Stool cultures requested but patient unable to provide a sample now.  Advising follow-up with his doctor within 3 to 4 days.  Advised immediate return if he has worsening symptoms fevers pain or any additional concerns.     Final Clinical Impression(s) / ED Diagnoses Final diagnoses:  Diarrhea, unspecified type    Rx / DC Orders ED Discharge Orders         Ordered    loperamide (IMODIUM) 2 MG capsule  4 times daily PRN        02/05/21 1233    potassium chloride (KLOR-CON) 10 MEQ tablet  Daily        02/05/21 1233           Cheryll Cockayne, MD 02/05/21 1234

## 2021-02-05 NOTE — ED Triage Notes (Signed)
Diarrhea x 1 week he states 7 to 10 x a day, denies black stools

## 2021-02-05 NOTE — ED Notes (Signed)
ED Provider at bedside. 

## 2021-02-05 NOTE — Discharge Instructions (Addendum)
Call your primary care doctor or specialist as discussed in the next 2-3 days.   Return immediately back to the ER if:  Your symptoms worsen within the next 12-24 hours. You develop new symptoms such as new fevers, persistent vomiting, new pain, shortness of breath, or new weakness or numbness, or if you have any other concerns.  

## 2021-04-24 ENCOUNTER — Encounter: Payer: Self-pay | Admitting: Cardiology

## 2021-04-24 ENCOUNTER — Other Ambulatory Visit: Payer: Self-pay

## 2021-04-24 ENCOUNTER — Ambulatory Visit: Payer: BC Managed Care – PPO | Admitting: Cardiology

## 2021-04-24 VITALS — BP 128/86 | HR 83 | Temp 98.0°F | Resp 16 | Ht 66.0 in | Wt 245.0 lb

## 2021-04-24 DIAGNOSIS — Z87891 Personal history of nicotine dependence: Secondary | ICD-10-CM

## 2021-04-24 DIAGNOSIS — R7303 Prediabetes: Secondary | ICD-10-CM

## 2021-04-24 DIAGNOSIS — Z6839 Body mass index (BMI) 39.0-39.9, adult: Secondary | ICD-10-CM

## 2021-04-24 DIAGNOSIS — Z01818 Encounter for other preprocedural examination: Secondary | ICD-10-CM

## 2021-04-24 DIAGNOSIS — Z955 Presence of coronary angioplasty implant and graft: Secondary | ICD-10-CM

## 2021-04-24 DIAGNOSIS — I2510 Atherosclerotic heart disease of native coronary artery without angina pectoris: Secondary | ICD-10-CM

## 2021-04-24 DIAGNOSIS — E7800 Pure hypercholesterolemia, unspecified: Secondary | ICD-10-CM

## 2021-04-24 MED ORDER — ASPIRIN EC 81 MG PO TBEC: 81.0000 mg | DELAYED_RELEASE_TABLET | Freq: Every day | ORAL | 3 refills | Status: AC

## 2021-04-24 NOTE — Progress Notes (Signed)
Date:  04/24/2021   ID:  Danny Espinoza, DOB Jun 17, 1971, MRN [id number]  PCP:  System, Provider Not In  Cardiologist:  Tessa Lerner, DO, [facility] (established care 04/24/2021)  REASON FOR CONSULT: Preop clearance  REQUESTING PHYSICIAN:  Frederico Hamman, MD [address] [place],  [place] [address]  Chief Complaint  Patient presents with  . Medical Clearance    RT knee  . New Patient (Initial Visit)    HPI  Danny Espinoza is a 50 y.o. male who presents to the office with a chief complaint of " preop clearance." Patient's past medical history and cardiovascular risk factors include: CAD s/p PCI at in late 30s, prediabetes, obesity due to excess calories, hypercholesterolemia.  He is referred to the office at the request of Frederico Hamman, MD for evaluation of preop clearance.  Patient is scheduled for a right knee arthroscopy with Dr. Madelon Lips in June 2022 tentatively.  The date is to be determined as per the patient.  Patient does not have any chest pain or anginal equivalent.  No past history of myocardial infarction, stroke, diabetes.  Patient states that is in his late 44s / early 41s he was diagnosed with coronary artery disease and had a PCI placed.  He does not know any additional details and does not have a stent card with him.  He does not follow-up with his former cardiologist and no recent PCP office visits.    Patient is currently not on statin therapy.  Patient states that he was on antiplatelet therapy but he discontinued it.   With regards to functional status patient states that he has been more than 3 months since he is going to the gym or when out for a walk.  His functional capacity has been limited due to his right knee pain.  FUNCTIONAL STATUS: No structured exercise program or daily routine.   ALLERGIES: No Known Allergies  MEDICATION LIST PRIOR TO VISIT: Current Meds  Medication Sig  . aspirin EC 81 MG tablet Take 1 tablet (81 mg total) by mouth  daily. Swallow whole.  . montelukast (SINGULAIR) 10 MG tablet Take by mouth as needed.  . WEGOVY 0.5 MG/0.5ML SOAJ SMARTSIG:0.5 Milliliter(s) SUB-Q Once a Week     PAST MEDICAL HISTORY: Past Medical History:  Diagnosis Date  . Coronary artery disease   . Obesity   . Thrombosis     PAST SURGICAL HISTORY: Past Surgical History:  Procedure Laterality Date  . APPENDECTOMY    . CARDIAC CATHETERIZATION      FAMILY HISTORY: The patient family history includes Heart attack in his mother; Hypertension in his brother and brother.  SOCIAL HISTORY:  The patient  reports that he quit smoking about 25 years ago. His smoking use included cigarettes. He has a 2.00 pack-year smoking history. He has never used smokeless tobacco. He reports current alcohol use. He reports that he does not use drugs.  REVIEW OF SYSTEMS: Review of Systems  Constitutional: Negative for chills and fever.  HENT: Negative for hoarse voice and nosebleeds.   Eyes: Negative for discharge, double vision and pain.  Cardiovascular: Negative for chest pain, claudication, dyspnea on exertion, leg swelling, near-syncope, orthopnea, palpitations, paroxysmal nocturnal dyspnea and syncope.  Respiratory: Negative for hemoptysis and shortness of breath.   Musculoskeletal: Negative for muscle cramps and myalgias.  Gastrointestinal: Negative for abdominal pain, constipation, diarrhea, hematemesis, hematochezia, melena, nausea and vomiting.  Neurological: Negative for dizziness and light-headedness.    PHYSICAL EXAM: Vitals with BMI  04/24/2021 02/05/2021 02/05/2021  Height 5\' 6"  - -  Weight 245 lbs - -  BMI 39.56 - -  Systolic 128 139  Diastolic 86 95 98  Pulse 83 76 78    CONSTITUTIONAL: Well-developed and well-nourished. No acute distress.  SKIN: Skin is warm and dry. No rash noted. No cyanosis. No pallor. No jaundice HEAD: Normocephalic and atraumatic.  EYES: No scleral icterus MOUTH/THROAT: Moist oral membranes.   NECK: No JVD present. No thyromegaly noted. No carotid bruits  LYMPHATIC: No visible cervical adenopathy.  CHEST Normal respiratory effort. No intercostal retractions  LUNGS: Clear to auscultation bilaterally.  No stridor. No wheezes. No rales.  CARDIOVASCULAR: Regular rate and rhythm, positive S1-S2, no murmurs rubs or gallops appreciated ABDOMINAL: Obese, soft, nontender, nondistended, positive bowel sounds in all 4 quadrants.  No apparent ascites.  EXTREMITIES: No peripheral edema, 2+ dorsalis pedis and posterior tibial pulses HEMATOLOGIC: No significant bruising NEUROLOGIC: Oriented to person, place, and time. Nonfocal. Normal muscle tone.  PSYCHIATRIC: Normal mood and affect. Normal behavior. Cooperative  CARDIAC DATABASE: EKG: 04/24/2021: Normal sinus rhythm, 77 bpm, normal axis, without underlying ischemia or injury pattern.  Echocardiogram: No results found for this or any previous visit from the past 1095 days.   Stress Testing: No results found for this or any previous visit from the past 1095 days.   Heart Catheterization: Atleast 10 years ago per patient - no images or report to review.   LABORATORY DATA: CBC Latest Ref Rng & Units 02/05/2021  WBC 4.0 - 10.5 K/uL 5.7  Hemoglobin 13.0 - 17.0 g/dL 12.9(L)  Hematocrit 39.0 - 52.0 % 39.8  Platelets 150 - 400 K/uL 301    CMP Latest Ref Rng & Units 02/05/2021  Glucose 70 - 99 mg/dL 02/07/2021)  BUN 6 - 20 mg/dL 9  Creatinine 939(Q - 3.00 mg/dL 9.23  Sodium 3.00 - 762 mmol/L 138  Potassium 3.5 - 5.1 mmol/L 3.0(L)  Chloride 98 - 111 mmol/L 104  CO2 22 - 32 mmol/L 24  Calcium 8.9 - 10.3 mg/dL 263)    Lipid Panel  No results found for: CHOL, TRIG, HDL, CHOLHDL, VLDL, LDLCALC, LDLDIRECT, LABVLDL  No components found for: NTPROBNP No results for input(s): PROBNP in the last 8760 hours. No results for input(s): TSH in the last 8760 hours.  BMP Recent Labs    02/05/21 1005  NA 138  K 3.0*  CL 104  CO2 24  GLUCOSE  149*  BUN 9  CREATININE 0.90  CALCIUM 8.5*  GFRNONAA >60    HEMOGLOBIN A1C No results found for: HGBA1C, MPG  External Labs: Collected: 02/20/2020 performed at [facility] available in Care Everywhere Lipid profile: Total cholesterol 163, triglycerides 69, HDL 49, LDL 91, non-HDL 114 Sodium 140, potassium 4.6, chloride 108, bicarb 25, BUN 13, creatinine 1.12, AST 14, ALT 13, alkaline phosphatase 40 TSH 0.74 Hemoglobin A1c 6.0  Collected 10/04/2020 Hemoglobin A1c 5.7  IMPRESSION:    ICD-10-CM   1. Pre-operative clearance  Z01.818 EKG 12-Lead    PCV ECHOCARDIOGRAM COMPLETE    PCV MYOCARDIAL PERFUSION WITH LEXISCAN  2. Atherosclerosis of native coronary artery of native heart without angina pectoris  I25.10 PCV ECHOCARDIOGRAM COMPLETE    PCV MYOCARDIAL PERFUSION WITH LEXISCAN    aspirin EC 81 MG tablet  3. History of PCI  Z95.5 PCV ECHOCARDIOGRAM COMPLETE    PCV MYOCARDIAL PERFUSION WITH LEXISCAN    aspirin EC 81 MG tablet  4. Former smoker  [phone]  5. Hypercholesteremia  E78.00   6. Prediabetes  R73.03   7. Class 2 severe obesity due to excess calories with serious comorbidity and body mass index (BMI) of 39.0 to 39.9 in adult [facility])  E66.01    Z68.39      RECOMMENDATIONS: Danny Espinoza is a 50 y.o. male whose past medical history and cardiac risk factors include: CAD s/p PCI at in late 30s, prediabetes, obesity due to excess calories, hypercholesterolemia.  Preoperative risk stratification: No history of recent unstable angina pectoris or myocardial infarction, no signs or symptoms of decompensated CHF, no unaddressed complex dysrhythmia, no significant aortic valvular stenosis. Patient does have history of coronary artery disease at relatively young age and underwent PCI intervention. Do not have any additional details with regards to his coronary disease but will try to request records from his provider in [facility]. Patient's functional status  is also limited due to his right knee pain for more than 90 days. Given his history of coronary artery disease with prior PCI, unknown functional status, the shared decision was to proceed with an ischemic evaluation for further restratification prior to upcoming elective noncardiac surgery. Echocardiogram will be ordered to evaluate for structural heart disease and left ventricular systolic function. Nuclear stress test recommended to evaluate for reversible ischemia.  Prediabetes: Reviewed outside records from care everywhere patient's most recent hemoglobin A1c as of October 2021 5.7. Patient is currently on pharmacological therapy. Recommended that he follows up with PCP -he has an appointment with a new provider next month.  Coronary artery disease with prior PCI intervention without angina pectoris: Recommend aspirin 81 mg p.o. daily. Most recent lipid profile from February 2021 reviewed recommend an LDL less than 70 mg/dL EKG shows normal sinus rhythm without underlying ischemia or injury pattern Ischemic evaluation as outlined above Educated on the importance of secondary prevention.  I would like to reevaluate the patient in 3 months given his known history of CAD.  Educated on the importance of improving his modifiable cardiovascular risk factors.  He is asked to bring in his blood work from his upcoming PCP office visit at the next encounter to update her records and to discuss disease management.  FINAL MEDICATION LIST END OF ENCOUNTER: Meds ordered this encounter  Medications  . aspirin EC 81 MG tablet    Sig: Take 1 tablet (81 mg total) by mouth daily. Swallow whole.    Dispense:  90 tablet    Refill:  3    Medications Discontinued During This Encounter  Medication Reason  . cyclobenzaprine (FLEXERIL) 10 MG tablet Error  . ketorolac (TORADOL) 10 MG tablet Error  . loperamide (IMODIUM) 2 MG capsule Error  . potassium chloride (KLOR-CON) 10 MEQ tablet Error     Current  Outpatient Medications:  .  aspirin EC 81 MG tablet, Take 1 tablet (81 mg total) by mouth daily. Swallow whole., Disp: 90 tablet, Rfl: 3 .  montelukast (SINGULAIR) 10 MG tablet, Take by mouth as needed., Disp: , Rfl:  .  WEGOVY 0.5 MG/0.5ML SOAJ, SMARTSIG:0.5 Milliliter(s) SUB-Q Once a Week, Disp: , Rfl:   Orders Placed This Encounter  Procedures  . PCV MYOCARDIAL PERFUSION WITH LEXISCAN  . EKG 12-Lead  . PCV ECHOCARDIOGRAM COMPLETE    There are no Patient Instructions on file for this visit.   --Continue cardiac medications as reconciled in final medication list. --Return in about 3 months (around 07/25/2021) for Follow up, CAD. Or sooner if needed. --Continue follow-up with your primary care  physician regarding the management of your other chronic comorbid conditions.  Patient's questions and concerns were addressed to his satisfaction. He voices understanding of the instructions provided during this encounter.   This note was created using a voice recognition software as a result there may be grammatical errors inadvertently enclosed that do not reflect the nature of this encounter. Every attempt is made to correct such errors.  Rex Kras, [place], [facility]  Pager: [phone] Office: [phone]

## 2021-04-25 ENCOUNTER — Ambulatory Visit: Payer: BC Managed Care – PPO

## 2021-04-25 DIAGNOSIS — Z955 Presence of coronary angioplasty implant and graft: Secondary | ICD-10-CM

## 2021-04-25 DIAGNOSIS — Z01818 Encounter for other preprocedural examination: Secondary | ICD-10-CM

## 2021-04-25 DIAGNOSIS — I2510 Atherosclerotic heart disease of native coronary artery without angina pectoris: Secondary | ICD-10-CM

## 2021-04-30 NOTE — Progress Notes (Signed)
No answer left a vm to call back

## 2021-05-01 NOTE — Progress Notes (Signed)
Called pt no answer, no answer, left a vm

## 2021-05-01 NOTE — Progress Notes (Signed)
Pt called back, spoke with them regarding test results. Pt voiced understanding.

## 2021-05-03 ENCOUNTER — Other Ambulatory Visit: Payer: Self-pay

## 2021-05-03 ENCOUNTER — Ambulatory Visit: Payer: BC Managed Care – PPO

## 2021-05-03 DIAGNOSIS — Z955 Presence of coronary angioplasty implant and graft: Secondary | ICD-10-CM

## 2021-05-03 DIAGNOSIS — I2510 Atherosclerotic heart disease of native coronary artery without angina pectoris: Secondary | ICD-10-CM

## 2021-05-03 DIAGNOSIS — Z01818 Encounter for other preprocedural examination: Secondary | ICD-10-CM

## 2021-05-15 NOTE — Progress Notes (Signed)
No answer left a vm to call back

## 2021-05-16 NOTE — Progress Notes (Signed)
Patient is aware 

## 2021-05-16 NOTE — Progress Notes (Signed)
2nd attempt : Called patient, NA, LMAM

## 2021-07-26 ENCOUNTER — Ambulatory Visit: Payer: BC Managed Care – PPO | Admitting: Cardiology

## 2021-07-26 DIAGNOSIS — Z955 Presence of coronary angioplasty implant and graft: Secondary | ICD-10-CM

## 2021-07-26 DIAGNOSIS — I2510 Atherosclerotic heart disease of native coronary artery without angina pectoris: Secondary | ICD-10-CM

## 2021-07-26 DIAGNOSIS — R7303 Prediabetes: Secondary | ICD-10-CM

## 2021-07-26 DIAGNOSIS — Z87891 Personal history of nicotine dependence: Secondary | ICD-10-CM

## 2021-07-26 DIAGNOSIS — Z01818 Encounter for other preprocedural examination: Secondary | ICD-10-CM

## 2021-07-26 DIAGNOSIS — E7800 Pure hypercholesterolemia, unspecified: Secondary | ICD-10-CM

## 2021-08-06 ENCOUNTER — Ambulatory Visit: Payer: BC Managed Care – PPO | Admitting: Cardiology

## 2022-02-06 ENCOUNTER — Other Ambulatory Visit: Payer: Self-pay

## 2022-02-06 ENCOUNTER — Encounter (HOSPITAL_BASED_OUTPATIENT_CLINIC_OR_DEPARTMENT_OTHER): Payer: Self-pay | Admitting: Emergency Medicine

## 2022-02-06 ENCOUNTER — Emergency Department (HOSPITAL_BASED_OUTPATIENT_CLINIC_OR_DEPARTMENT_OTHER)
Admission: EM | Admit: 2022-02-06 | Discharge: 2022-02-06 | Disposition: A | Discharge status: Home / Self Care | Payer: BC Managed Care – PPO | Attending: Emergency Medicine | Admitting: Emergency Medicine

## 2022-02-06 DIAGNOSIS — Z79899 Other long term (current) drug therapy: Secondary | ICD-10-CM | POA: Insufficient documentation

## 2022-02-06 DIAGNOSIS — R519 Headache, unspecified: Secondary | ICD-10-CM | POA: Diagnosis present

## 2022-02-06 DIAGNOSIS — G44209 Tension-type headache, unspecified, not intractable: Secondary | ICD-10-CM | POA: Insufficient documentation

## 2022-02-06 DIAGNOSIS — I10 Essential (primary) hypertension: Secondary | ICD-10-CM | POA: Diagnosis not present

## 2022-02-06 DIAGNOSIS — Z7982 Long term (current) use of aspirin: Secondary | ICD-10-CM | POA: Diagnosis not present

## 2022-02-06 MED ORDER — ACETAMINOPHEN 500 MG PO TABS
1000.0000 mg | ORAL_TABLET | Freq: Once | ORAL | Status: AC
  Administered 2022-02-06: 1000 mg via ORAL
  Filled 2022-02-06: qty 2

## 2022-02-06 NOTE — ED Provider Notes (Signed)
[facility] EMERGENCY DEPARTMENT Provider Note   CSN: [id number] Arrival date & time: 02/06/22  0745     History  Chief Complaint  Patient presents with   Hypertension    Danny Espinoza is a 51 y.o. male.   Hypertension Associated symptoms include headaches.    51 year old male presenting to the emergency department with headache and a complaint of high blood pressure.  The patient states that he has had a headache since yesterday.  He endorses a headache in a bandlike pattern across his forehead.  He denies any neck pain or stiffness.  He denies any fevers or chills.  He denies any vision changes, chest pain, shortness of breath.  He does complain of elevated blood pressure at home.  He has no other complaints.  Home Medications Prior to Admission medications   Medication Sig Start Date End Date Taking? Authorizing Provider  aspirin EC 81 MG tablet Take 1 tablet (81 mg total) by mouth daily. Swallow whole. 04/24/21   Tolia, Sunit, DO  montelukast (SINGULAIR) 10 MG tablet Take by mouth as needed. 03/06/21 03/07/22  [provider]  WEGOVY 0.5 MG/0.5ML SOAJ SMARTSIG:0.5 Milliliter(s) SUB-Q Once a Week 03/08/21   [provider]      Allergies    Patient has no known allergies.    Review of Systems   Review of Systems  Neurological:  Positive for headaches.  All other systems reviewed and are negative.  Physical Exam Updated Vital Signs BP (!) 145/105    Pulse 91    Temp 98.4 F (36.9 C) (Oral)    Resp (!) 22    Ht 5\' 6"  (1.676 m)    Wt 107.5 kg    SpO2 97%    BMI 38.25 kg/m  Physical Exam Vitals and nursing note reviewed.  Constitutional:      General: He is not in acute distress.    Appearance: He is well-developed.  HENT:     Head: Normocephalic and atraumatic.  Eyes:     Conjunctiva/sclera: Conjunctivae normal.     Pupils: Pupils are equal, round, and reactive to light.  Cardiovascular:     Rate and Rhythm: Normal rate and regular rhythm.      Heart sounds: No murmur heard. Pulmonary:     Effort: Pulmonary effort is normal. No respiratory distress.     Breath sounds: Normal breath sounds.  Abdominal:     General: There is no distension.     Palpations: Abdomen is soft.     Tenderness: There is no abdominal tenderness. There is no guarding.  Musculoskeletal:        General: No swelling, deformity or signs of injury.     Cervical back: Neck supple.  Skin:    General: Skin is warm and dry.     Capillary Refill: Capillary refill takes less than 2 seconds.     Findings: No lesion or rash.  Neurological:     General: No focal deficit present.     Mental Status: He is alert. Mental status is at baseline.     Comments: MENTAL STATUS EXAM:    Orientation: Alert and oriented to person, place and time.  Memory: Cooperative, follows commands well.  Language: Speech is clear and language is normal.   CRANIAL NERVES:    CN 2 (Optic): Visual fields intact to confrontation.  CN 3,4,6 (EOM): Pupils equal and reactive to light. Full extraocular eye movement without nystagmus.  CN 5 (Trigeminal): Facial sensation is normal,  no weakness of masticatory muscles.  CN 7 (Facial): No facial weakness or asymmetry.  CN 8 (Auditory): Auditory acuity grossly normal.  CN 9,10 (Glossophar): The uvula is midline, the palate elevates symmetrically.  CN 11 (spinal access): Normal sternocleidomastoid and trapezius strength.  CN 12 (Hypoglossal): The tongue is midline. No atrophy or fasciculations.Marland Kitchen   MOTOR:  Muscle Strength: 5/5RUE, 5/5LUE, 5/5RLE, 5/5LLE.   COORDINATION:   Intact finger-to-nose, no tremor.   SENSATION:   Intact to light touch all four extremities.  GAIT: Gait normal without ataxia   Psychiatric:        Mood and Affect: Mood normal.    ED Results / Procedures / Treatments   Labs (all labs ordered are listed, but only abnormal results are displayed) Labs Reviewed - No data to display  EKG EKG  Interpretation  Date/Time:  Wednesday February 06 2022 07:56:50 EST Ventricular Rate:  86 PR Interval:  154 QRS Duration: 100 QT Interval:  360 QTC Calculation: 431 R Axis:   12 Text Interpretation: Sinus rhythm No previous ECGs available Confirmed by Ernie Avena ([id number]) on 02/06/2022 8:08:14 AM  Radiology No results found.  Procedures Procedures    Medications Ordered in ED Medications  acetaminophen (TYLENOL) tablet 1,000 mg (1,000 mg Oral Given 02/06/22 0906)    ED Course/ Medical Decision Making/ A&P                           Medical Decision Making Risk OTC drugs.   51 year old male presenting to the emergency department with headache and a complaint of high blood pressure.  The patient states that he has had a headache since yesterday.  He endorses a headache in a bandlike pattern across his forehead.  He denies any neck pain or stiffness.  He denies any fevers or chills.  He denies any vision changes, chest pain, shortness of breath.  He does complain of elevated blood pressure at home.  He has no other complaints.   Currently he is awake, alert, GCS 15, HDS, and afebrile. His exam is most notable for normal gait, fully intact extraocular motions with bilaterally reactive pupils, no focal neurologic deficits, no meningismus, and no temporal tenderness. There is no rash. The headache was not sudden onset or the worst headache of the patient's life. There is no visual deficit.  I am most concerned for tension type headache and asymptomatic hypertension.  ECG: Sinus rhythm, ventricular rate 86, no ischemic changes.  I do not think the patient has an aneurysm, intracranial bleed, mass lesion, meningitis, temporal arteritis, stroke, cluster headache, idiopathic intracranial hypertension, cavernous sinus thrombosis, carbon monoxide toxicity, herpes zoster, carotid or vertebral artery dissection, or acute angle close glaucoma.  On reassessment, the patient remained well  appearing and was again able to ambulate without difficulty and did not have any focal neurologic deficits.  I believe the patient is stable for outpatient management and discharge.  We participated in shared decision making regarding continued observation in the ED versus discharge home for continued recovery after receiving the below medications. He preferred to recover at home. I believe that this is safe and reasonable.  The patient has been appropriately medically screened and/or stabilized in the ED. I have low suspicion for any other emergent medical condition which would require further screening, evaluation or treatment in the ED or require inpatient management.  We have discussed the diagnosis and risks, and we agree with discharging home to follow-up with their  primary doctor. We also discussed returning to the Emergency Department immediately if new or worsening symptoms occur. We have discussed the symptoms which are most concerning (e.g., changing or worsening pain, weakness, vomiting, fever, or abnormal sensation) that necessitate immediate return. I provided ED return precautions. The patient felt safe with this plan.  ED Medication Summary: Medications  acetaminophen (TYLENOL) tablet 1,000 mg (1,000 mg Oral Given 02/06/22 0906)       Final Clinical Impression(s) / ED Diagnoses Final diagnoses:  Asymptomatic hypertension  Acute non intractable tension-type headache    Rx / DC Orders ED Discharge Orders     None         Ernie Avena, MD 02/08/22 1024

## 2022-02-06 NOTE — Discharge Instructions (Addendum)
Please follow-up with your PCP to discuss chronic blood pressure management.

## 2022-02-06 NOTE — ED Triage Notes (Signed)
Pt arrives pov, endorses HA with tenderness since yesterday and hypertension today. Pt denies CP

## 2022-05-17 ENCOUNTER — Emergency Department (HOSPITAL_BASED_OUTPATIENT_CLINIC_OR_DEPARTMENT_OTHER): Payer: BC Managed Care – PPO

## 2022-05-17 ENCOUNTER — Emergency Department (HOSPITAL_BASED_OUTPATIENT_CLINIC_OR_DEPARTMENT_OTHER)
Admission: EM | Admit: 2022-05-17 | Discharge: 2022-05-17 | Disposition: A | Discharge status: Home / Self Care | Payer: BC Managed Care – PPO | Attending: Emergency Medicine | Admitting: Emergency Medicine

## 2022-05-17 ENCOUNTER — Other Ambulatory Visit: Payer: Self-pay

## 2022-05-17 ENCOUNTER — Encounter (HOSPITAL_BASED_OUTPATIENT_CLINIC_OR_DEPARTMENT_OTHER): Payer: Self-pay | Admitting: Emergency Medicine

## 2022-05-17 DIAGNOSIS — Z7982 Long term (current) use of aspirin: Secondary | ICD-10-CM | POA: Insufficient documentation

## 2022-05-17 DIAGNOSIS — I2510 Atherosclerotic heart disease of native coronary artery without angina pectoris: Secondary | ICD-10-CM | POA: Insufficient documentation

## 2022-05-17 DIAGNOSIS — M25552 Pain in left hip: Secondary | ICD-10-CM | POA: Diagnosis not present

## 2022-05-17 MED ORDER — METHOCARBAMOL 500 MG PO TABS: 500.0000 mg | ORAL_TABLET | Freq: Two times a day (BID) | ORAL | 0 refills | Status: AC

## 2022-05-17 MED ORDER — OXYCODONE-ACETAMINOPHEN 5-325 MG PO TABS
1.0000 | ORAL_TABLET | Freq: Once | ORAL | Status: AC
  Administered 2022-05-17: 1 via ORAL
  Filled 2022-05-17: qty 1

## 2022-05-17 MED ORDER — KETOROLAC TROMETHAMINE 30 MG/ML IJ SOLN
30.0000 mg | Freq: Once | INTRAMUSCULAR | Status: AC
Start: 2022-05-17 — End: 2022-05-17
  Administered 2022-05-17: 30 mg via INTRAMUSCULAR
  Filled 2022-05-17: qty 1

## 2022-05-17 MED ORDER — ACETAMINOPHEN 325 MG PO TABS
650.0000 mg | ORAL_TABLET | Freq: Once | ORAL | Status: AC
  Administered 2022-05-17: 650 mg via ORAL
  Filled 2022-05-17: qty 2

## 2022-05-17 NOTE — ED Triage Notes (Signed)
Pt arrives pov, to triage in wheelchair c/o left hip pain x 1 month, worsening today

## 2022-05-17 NOTE — ED Provider Notes (Signed)
[facility] EMERGENCY DEPARTMENT Provider Note   CSN: [id number] Arrival date & time: 05/17/22  1734     History  Chief Complaint  Patient presents with   Hip Pain    Danny Espinoza is a 51 y.o. male.   Hip Pain Patient is a 51 year old male with a past medical history significant for CAD, obesity, prediabetes  He is presented to the emergency room today with complaints of left hip pain ongoing for 1 month seems that it has been somewhat progressively worsening.  He denies any back injuries but does state that he moved in an abnormal way about a month ago and since then has had some achy pain.  He denies any abdominal pain chest pain nausea vomiting diarrhea urinary frequency urgency dysuria hematuria.  No other associate symptoms.  No fevers lightheadedness or dizziness.  He has been using some topical medications without significant improvement.     Home Medications Prior to Admission medications   Medication Sig Start Date End Date Taking? Authorizing Provider  aspirin EC 81 MG tablet Take 1 tablet (81 mg total) by mouth daily. Swallow whole. 04/24/21   Tolia, Sunit, DO  montelukast (SINGULAIR) 10 MG tablet Take by mouth as needed. 03/06/21 03/07/22  [provider]  WEGOVY 0.5 MG/0.5ML SOAJ SMARTSIG:0.5 Milliliter(s) SUB-Q Once a Week 03/08/21   [provider]      Allergies    Patient has no known allergies.    Review of Systems   Review of Systems  Physical Exam Updated Vital Signs BP (!) 171/94 (BP Location: Right Arm)   Pulse 93   Temp 98.5 F (36.9 C) (Oral)   Resp 17   Ht 5\' 6"  (1.676 m)   Wt 112 kg   SpO2 99%   BMI 39.87 kg/m  Physical Exam Vitals and nursing note reviewed.  Constitutional:      General: He is not in acute distress.    Appearance: He is obese.  HENT:     Head: Normocephalic and atraumatic.     Nose: Nose normal.  Eyes:     General: No scleral icterus. Cardiovascular:     Rate and Rhythm: Normal rate and  regular rhythm.     Pulses: Normal pulses.     Heart sounds: Normal heart sounds.     Comments: DP and PT pulses are 3+ in left lower extremity cap refill intact and sensation and movement normal. Pulmonary:     Effort: Pulmonary effort is normal. No respiratory distress.     Breath sounds: No wheezing.  Abdominal:     Palpations: Abdomen is soft.     Tenderness: There is no abdominal tenderness.     Comments: Obese soft abdomen none tender no guarding or rebound.  Musculoskeletal:     Cervical back: Normal range of motion.     Right lower leg: No edema.     Left lower leg: No edema.     Comments: Discomfort with manipulation of the left hip joint but not significantly tender to palpation of the left hip.  Able to lift left foot off of that completely.  Skin:    General: Skin is warm and dry.     Capillary Refill: Capillary refill takes less than 2 seconds.  Neurological:     Mental Status: He is alert. Mental status is at baseline.  Psychiatric:        Mood and Affect: Mood normal.        Behavior: Behavior normal.  ED Results / Procedures / Treatments   Labs (all labs ordered are listed, but only abnormal results are displayed) Labs Reviewed - No data to display  EKG None  Radiology DG Lumbar Spine Complete  Result Date: 05/17/2022 CLINICAL DATA:  Low back pain EXAM: LUMBAR SPINE - COMPLETE 4+ VIEW COMPARISON:  None Available. FINDINGS: Five non rib-bearing lumbar type vertebra. Lumbar alignment within normal limits. Vertebral body heights are maintained. Mild disc space narrowing and degenerative change at L4-L5. IMPRESSION: Mild degenerative change at L4-L5. Electronically Signed   By: Jasmine Pang M.D.   On: 05/17/2022 18:35   DG Hip Unilat W or Wo Pelvis 2-3 Views Left  Result Date: 05/17/2022 CLINICAL DATA:  Left-sided hip pain EXAM: DG HIP (WITH OR WITHOUT PELVIS) 2-3V LEFT COMPARISON:  None Available. FINDINGS: SI joints are non widened. Pubic symphysis and rami  are intact. No fracture or malalignment. The joint space is patent IMPRESSION: Negative. Electronically Signed   By: Jasmine Pang M.D.   On: 05/17/2022 18:34    Procedures Procedures    Medications Ordered in ED Medications  ketorolac (TORADOL) 30 MG/ML injection 30 mg (30 mg Intramuscular Given 05/17/22 1818)  acetaminophen (TYLENOL) tablet 650 mg (650 mg Oral Given 05/17/22 1818)  oxyCODONE-acetaminophen (PERCOCET/ROXICET) 5-325 MG per tablet 1 tablet (1 tablet Oral Given 05/17/22 1818)    ED Course/ Medical Decision Making/ A&P                           Medical Decision Making Amount and/or Complexity of Data Reviewed Radiology: ordered.  Risk OTC drugs. Prescription drug management.    Patient is a 51 year old male with a past medical history significant for CAD, obesity, prediabetes  He is presented to the emergency room today with complaints of left hip pain ongoing for 1 month seems that it has been somewhat progressively worsening.  He denies any back injuries but does state that he moved in an abnormal way about a month ago and since then has had some achy pain.  He denies any abdominal pain chest pain nausea vomiting diarrhea urinary frequency urgency dysuria hematuria.  No other associate symptoms.  No fevers lightheadedness or dizziness.  He has been using some topical medications without significant improvement.  Physical exam notable for discomfort with range of motion but negative straight leg raise.  Does not seem to be experiencing radicular symptoms.  No C, T, L-spine tenderness palpation.  Ultimately his pain seems to be in the left hip and radiating down.  I suspect some degree of piriformis syndrome or sciatic nerve irritation. He is following with orthopedics closely I will see them within the next few days.  We will hold off on prednisone because of negative straight leg raise and prescribe Robaxin, Tylenol ibuprofen topical medications and close follow-up  recommended.   Ambulatory with cane at DC.   Final Clinical Impression(s) / ED Diagnoses Final diagnoses:  Left hip pain    Rx / DC Orders ED Discharge Orders     None         Gailen Shelter, [place] 05/17/22 1853    Glynn Octave, MD 05/17/22 2315

## 2022-05-17 NOTE — Discharge Instructions (Addendum)
Your xrays were without fracture.  Please use Tylenol or ibuprofen for pain.  You may use 600 mg ibuprofen every 6 hours or 1000 mg of Tylenol every 6 hours.  You may choose to alternate between the 2.  This would be most effective.  Not to exceed 4 g of Tylenol within 24 hours.  Not to exceed 3200 mg ibuprofen 24 hours.   I have prescribed you a muscle relaxer Robaxin as a pain medicine.  As we discussed this is not a medicine that actually works in your muscles.  It will cause some drowsiness.

## 2022-11-28 ENCOUNTER — Encounter (HOSPITAL_BASED_OUTPATIENT_CLINIC_OR_DEPARTMENT_OTHER): Payer: Self-pay | Admitting: Urology

## 2022-11-28 ENCOUNTER — Other Ambulatory Visit: Payer: Self-pay

## 2022-11-28 ENCOUNTER — Emergency Department (HOSPITAL_BASED_OUTPATIENT_CLINIC_OR_DEPARTMENT_OTHER)
Admission: EM | Admit: 2022-11-28 | Discharge: 2022-11-28 | Discharge status: Against Medical Advice | Payer: BC Managed Care – PPO | Attending: Emergency Medicine | Admitting: Emergency Medicine

## 2022-11-28 DIAGNOSIS — Z5321 Procedure and treatment not carried out due to patient leaving prior to being seen by health care provider: Secondary | ICD-10-CM | POA: Diagnosis not present

## 2022-11-28 DIAGNOSIS — R519 Headache, unspecified: Secondary | ICD-10-CM | POA: Diagnosis present

## 2022-11-28 NOTE — ED Triage Notes (Signed)
Pt states headache that started this am  Denies n/v, denies any other associated symptoms   Took goody powder 2 hrs PTA  Reports lower  back surgery 2 months ago

## 2023-08-06 IMAGING — CR DG LUMBAR SPINE COMPLETE 4+V
5 series · 5 of 5 positions shown · non-contrast
Comparison: None Available.

CLINICAL DATA: Low back pain

EXAM:
LUMBAR SPINE - COMPLETE 4+ VIEW

[t l-spine oblique exposure (1 of 2)]
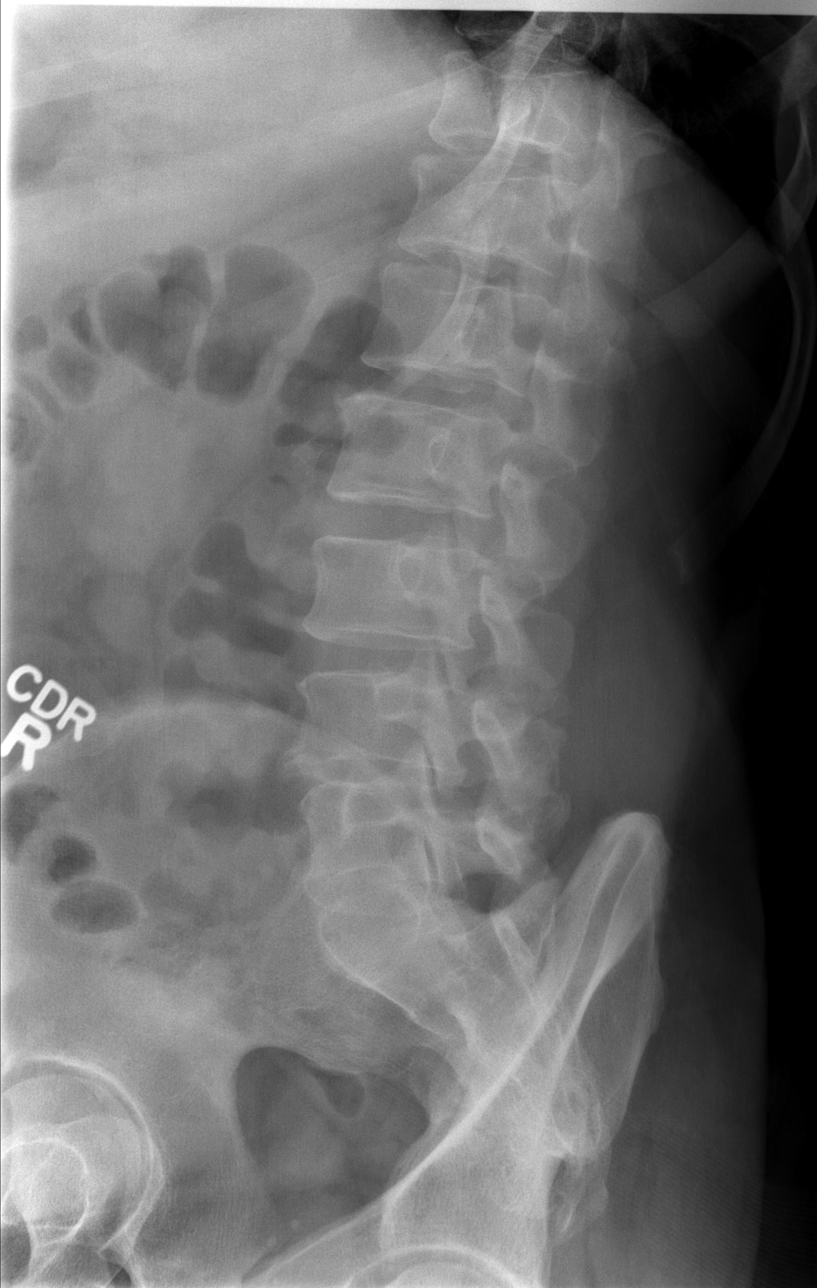

[t l-spine a.p.]
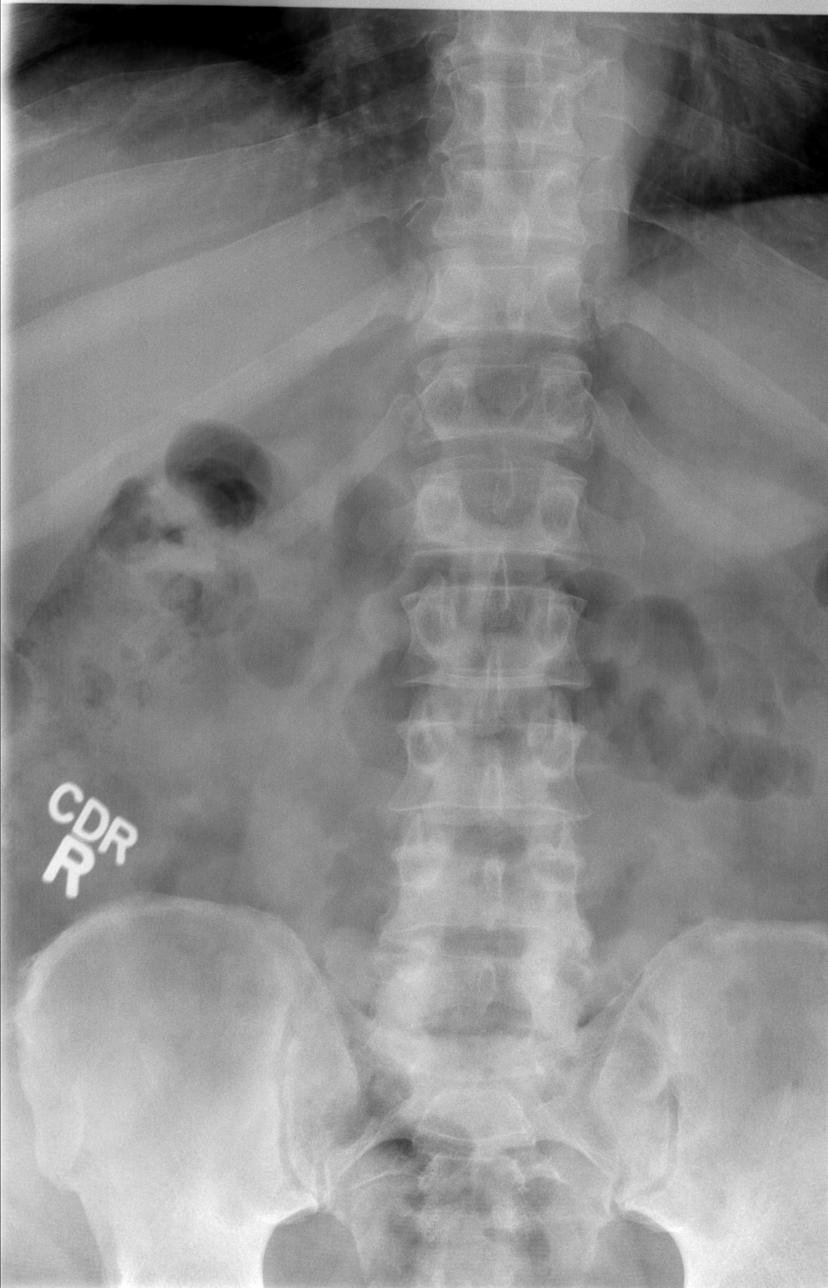

[t l-spine oblique exposure (2 of 2)]
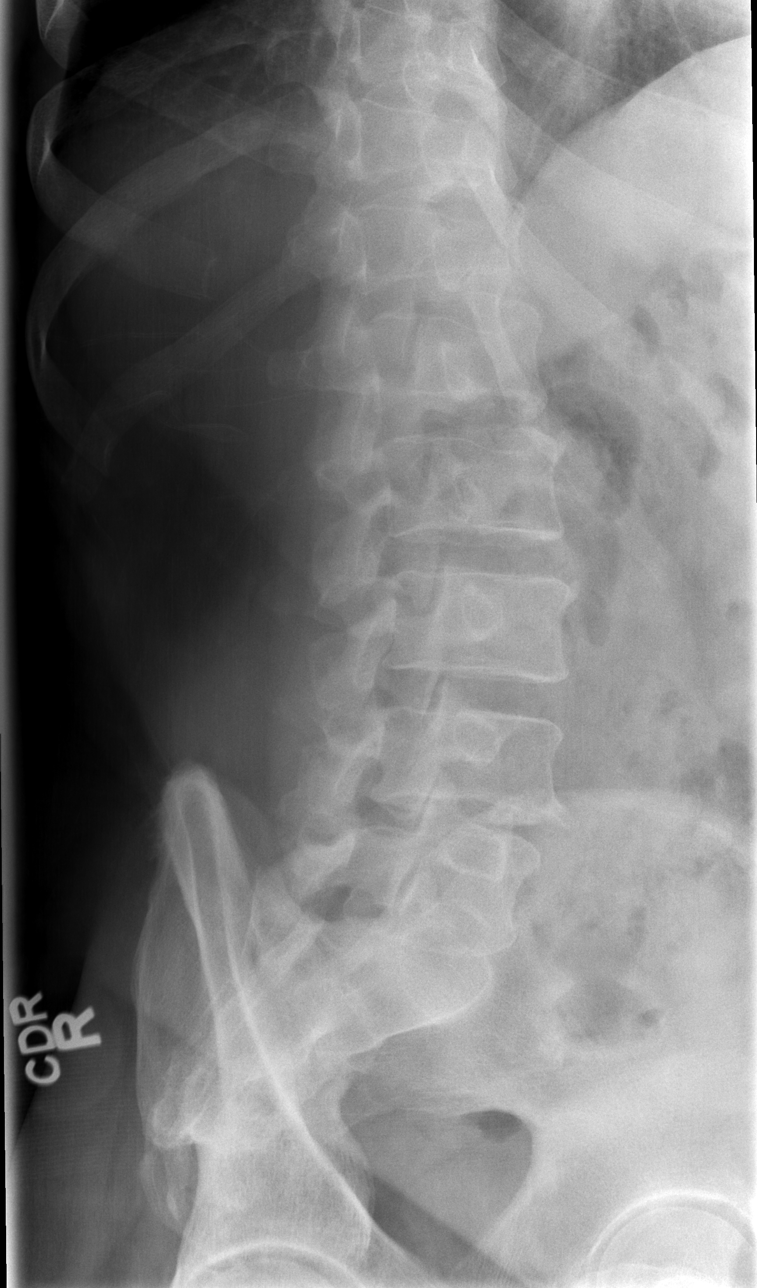

[t l-spine lat]
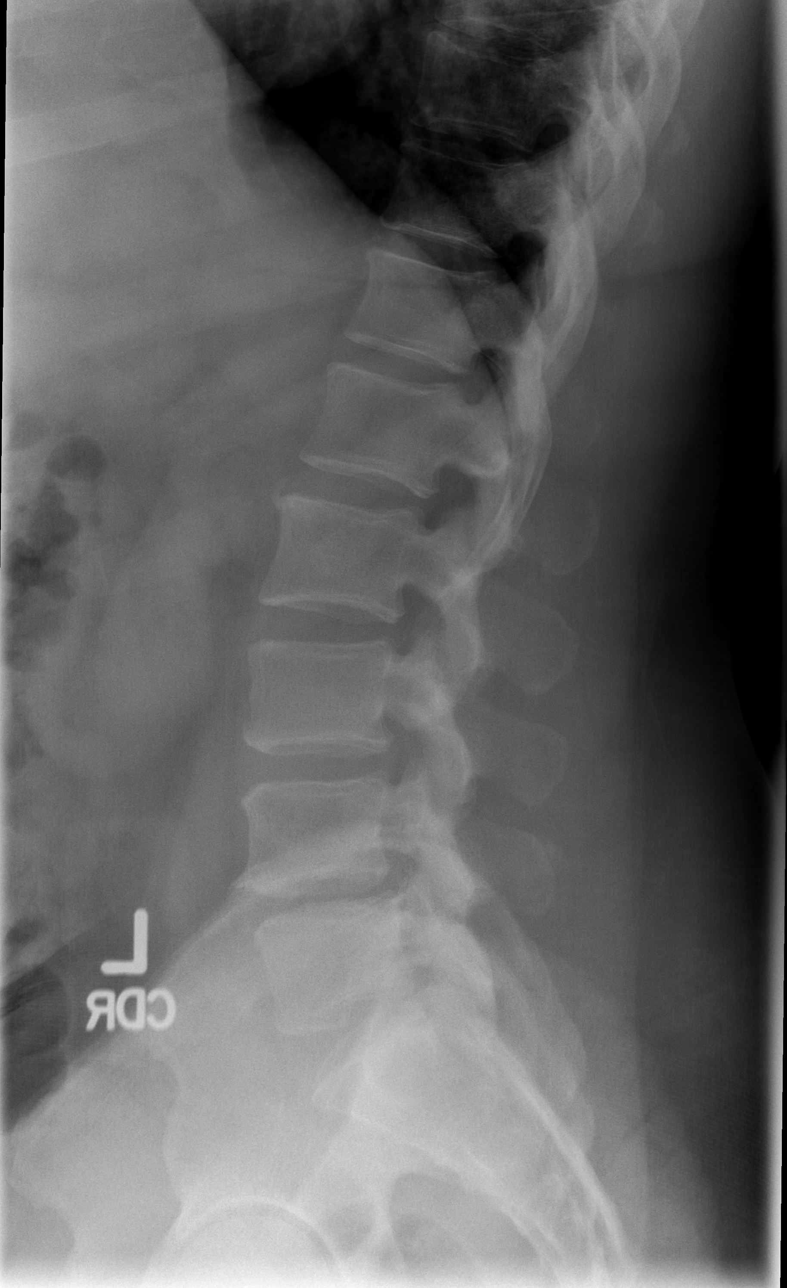

[t l-spine l5-s1 spot]
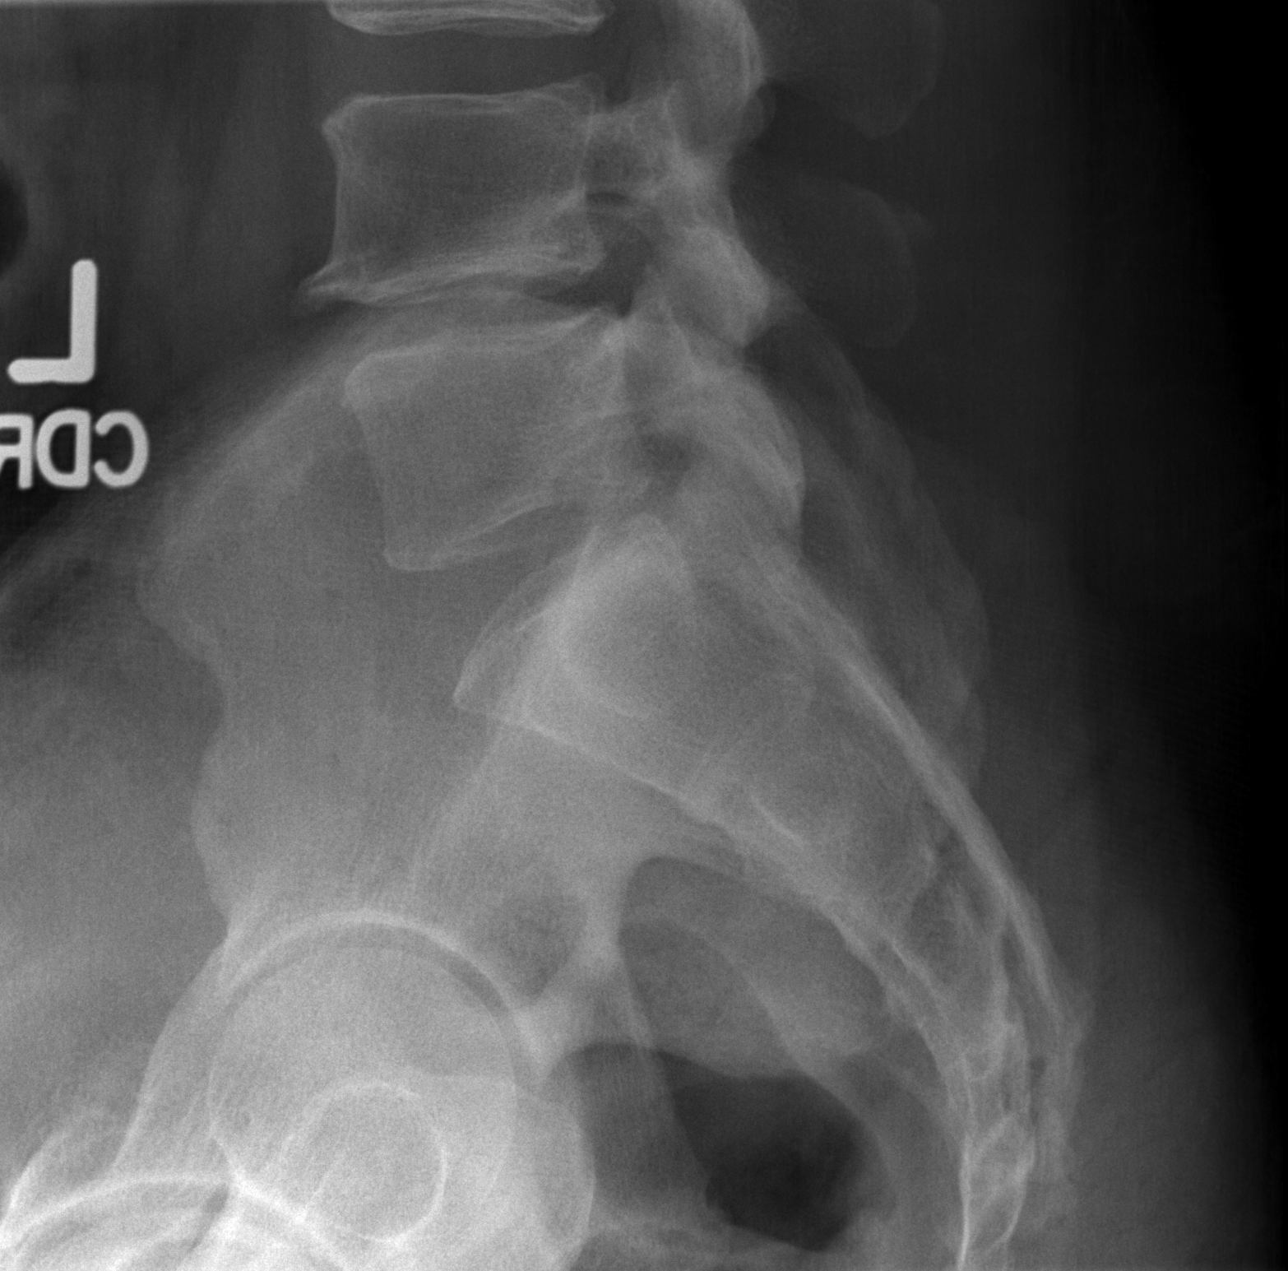

[5 of 5 positions shown; findings below may reference images not displayed]

FINDINGS: Five non rib-bearing lumbar type vertebra. Lumbar alignment within
normal limits. Vertebral body heights are maintained. Mild disc
space narrowing and degenerative change at L4-L5.
IMPRESSION: Mild degenerative change at L4-L5.

## 2023-12-28 ENCOUNTER — Encounter (HOSPITAL_BASED_OUTPATIENT_CLINIC_OR_DEPARTMENT_OTHER): Payer: Self-pay | Admitting: Emergency Medicine

## 2023-12-28 ENCOUNTER — Emergency Department (HOSPITAL_BASED_OUTPATIENT_CLINIC_OR_DEPARTMENT_OTHER): Payer: BC Managed Care – PPO

## 2023-12-28 ENCOUNTER — Other Ambulatory Visit: Payer: Self-pay

## 2023-12-28 ENCOUNTER — Emergency Department (HOSPITAL_BASED_OUTPATIENT_CLINIC_OR_DEPARTMENT_OTHER)
Admission: EM | Admit: 2023-12-28 | Discharge: 2023-12-28 | Disposition: A | Discharge status: Home / Self Care | Payer: BC Managed Care – PPO | Attending: Emergency Medicine | Admitting: Emergency Medicine

## 2023-12-28 DIAGNOSIS — R519 Headache, unspecified: Secondary | ICD-10-CM | POA: Diagnosis present

## 2023-12-28 DIAGNOSIS — Z79899 Other long term (current) drug therapy: Secondary | ICD-10-CM | POA: Diagnosis not present

## 2023-12-28 DIAGNOSIS — I2510 Atherosclerotic heart disease of native coronary artery without angina pectoris: Secondary | ICD-10-CM | POA: Insufficient documentation

## 2023-12-28 DIAGNOSIS — Z7982 Long term (current) use of aspirin: Secondary | ICD-10-CM | POA: Insufficient documentation

## 2023-12-28 DIAGNOSIS — I10 Essential (primary) hypertension: Secondary | ICD-10-CM | POA: Insufficient documentation

## 2023-12-28 HISTORY — DX: Essential (primary) hypertension: I10

## 2023-12-28 LAB — BASIC METABOLIC PANEL
Anion gap: 8 (ref 5–15)
BUN: 13 mg/dL (ref 6–20)
CO2: 24 mmol/L (ref 22–32)
Calcium: 8.7 mg/dL — ABNORMAL LOW (ref 8.9–10.3)
Chloride: 108 mmol/L (ref 98–111)
Creatinine, Ser: 0.8 mg/dL (ref 0.61–1.24)
GFR, Estimated: >60 mL/min (ref >60)
Glucose, Bld: 84 mg/dL (ref 70–99)
Potassium: 4.2 mmol/L (ref 3.5–5.1)
Sodium: 140 mmol/L (ref 135–145)

## 2023-12-28 LAB — CBC WITH DIFFERENTIAL/PLATELET
Abs Immature Granulocytes: 0.01 10*3/uL (ref 0.00–0.07)
Basophils Absolute: 0 10*3/uL (ref 0.0–0.1)
Basophils Relative: 1 %
Eosinophils Absolute: 0.1 10*3/uL (ref 0.0–0.5)
Eosinophils Relative: 3 %
HCT: 41.2 % (ref 39.0–52.0)
Hemoglobin: 13.4 g/dL (ref 13.0–17.0)
Immature Granulocytes: 0 %
Lymphocytes Relative: 35 %
Lymphs Abs: 2 10*3/uL (ref 0.7–4.0)
MCH: 28 pg (ref 26.0–34.0)
MCHC: 32.5 g/dL (ref 30.0–36.0)
MCV: 86 fL (ref 80.0–100.0)
Monocytes Absolute: 0.6 10*3/uL (ref 0.1–1.0)
Monocytes Relative: 11 %
Neutro Abs: 2.8 10*3/uL (ref 1.7–7.7)
Neutrophils Relative %: 50 %
Platelets: 295 10*3/uL (ref 150–400)
RBC: 4.79 MIL/uL (ref 4.22–5.81)
RDW: 13.6 % (ref 11.5–15.5)
WBC: 5.6 10*3/uL (ref 4.0–10.5)
nRBC: 0 % (ref 0.0–0.2)

## 2023-12-28 MED ORDER — IOHEXOL 350 MG/ML SOLN
75.0000 mL | Freq: Once | INTRAVENOUS | Status: AC | PRN
  Administered 2023-12-28: 75 mL via INTRAVENOUS

## 2023-12-28 MED ORDER — METOCLOPRAMIDE HCL 10 MG PO TABS
10.0000 mg | ORAL_TABLET | Freq: Once | ORAL | Status: AC
  Administered 2023-12-28: 10 mg via ORAL
  Filled 2023-12-28: qty 1

## 2023-12-28 MED ORDER — DEXAMETHASONE SODIUM PHOSPHATE 10 MG/ML IJ SOLN
10.0000 mg | Freq: Once | INTRAMUSCULAR | Status: AC
  Administered 2023-12-28: 10 mg via INTRAVENOUS
  Filled 2023-12-28: qty 1

## 2023-12-28 MED ORDER — KETOROLAC TROMETHAMINE 15 MG/ML IJ SOLN
15.0000 mg | Freq: Once | INTRAMUSCULAR | Status: AC
  Administered 2023-12-28: 15 mg via INTRAVENOUS
  Filled 2023-12-28: qty 1

## 2023-12-28 NOTE — ED Provider Notes (Signed)
 [facility] Provider Note   CSN: [id number] Arrival date & time: 12/28/23  9370     History  Chief Complaint  Patient presents with   Headache    Danny Espinoza is a 53 y.o. male with a history of CAD, HTN, and prediabetes who presents with a headache.   Patient states his headache began 2 weeks ago, is located on the top of his scalp with pain sometimes radiating to the left side of his head. The pain does improve in severity from day to day but is always there at baseline. He has tried taking Excedrin and Tylenol  with minimal relief. Associated symptoms include 1x episode of nausea (no vomiting) and 1x episode of possible numbness or tingling feeling on the left side of his face. He denies any vision changes, ear pain, confusion, speech changes, recent illness, trauma/injury, or fever. Has been eating and drinking fine at home. Patient expressed some concern regarding his ongoing headache given that he had a friend who recently passed away from an aneurysm.   Patient takes amlodipine 5 mg daily for high blood pressure, aspirin  81 mg for CAD/stents placed several years ago, Robaxin  500 mg BID for muscle/back pain, and a weekly injection for weight loss (prediabetes). He reports he had an elevated BP at home (does not recall BP) and he did not take his medications this morning. He denies any chest pain, heart palpitations, heart racing, numbness, or weakness.   Of note, patient does have a hoarse voice but denies any recent illness or other URI symptoms, states he was laughing/being loud last night.    Headache Associated symptoms: no photophobia and no weakness        Home Medications Prior to Admission medications   Medication Sig Start Date End Date Taking? Authorizing Provider  aspirin  EC 81 MG tablet Take 1 tablet (81 mg total) by mouth daily. Swallow whole. 04/24/21   Tolia, Sunit, DO  methocarbamol  (ROBAXIN ) 500 MG tablet Take 1  tablet (500 mg total) by mouth 2 (two) times daily. 05/17/22   Fondaw, Hamp RAMAN, PA  montelukast (SINGULAIR) 10 MG tablet Take by mouth as needed. 03/06/21 03/07/22  [provider]  WEGOVY 0.5 MG/0.5ML SOAJ SMARTSIG:0.5 Milliliter(s) SUB-Q Once a Week 03/08/21   [provider]      Allergies    Patient has no known allergies.    Review of Systems   Review of Systems  Eyes:  Negative for photophobia and visual disturbance.  Cardiovascular:  Negative for chest pain and palpitations.  Neurological:  Positive for headaches. Negative for facial asymmetry and weakness.  Psychiatric/Behavioral:  Negative for confusion.     Physical Exam Updated Vital Signs BP 128/84   Pulse 75   Temp 98.4 F (36.9 C) (Oral)   Resp 15   Ht 5' 6 (1.676 m)   Wt 117.9 kg   SpO2 97%   BMI 41.97 kg/m  Physical Exam HENT:     Head: Normocephalic and atraumatic.  Eyes:     General: No visual field deficit.    Extraocular Movements: Extraocular movements intact.     Pupils: Pupils are equal, round, and reactive to light.  Cardiovascular:     Rate and Rhythm: Normal rate and regular rhythm.     Heart sounds: Normal heart sounds.  Pulmonary:     Effort: Pulmonary effort is normal.     Breath sounds: Normal breath sounds.  Abdominal:     General:  Bowel sounds are normal.     Palpations: Abdomen is soft.  Musculoskeletal:        General: Normal range of motion.     Cervical back: Normal range of motion and neck supple. No rigidity.  Skin:    General: Skin is warm and dry.  Neurological:     Mental Status: He is alert and oriented to person, place, and time.     Cranial Nerves: No cranial nerve deficit, dysarthria or facial asymmetry.     Motor: No weakness.  Psychiatric:        Mood and Affect: Mood normal.        Behavior: Behavior normal.     ED Results / Procedures / Treatments   Labs (all labs ordered are listed, but only abnormal results are displayed) Labs Reviewed   BASIC METABOLIC PANEL - Abnormal; Notable for the following components:      Result Value   Calcium 8.7 (*)    All other components within normal limits  CBC WITH DIFFERENTIAL/PLATELET      Latest Ref Rng & Units 12/28/2023    9:55 AM 02/05/2021   10:05 AM  CBC  WBC 4.0 - 10.5 K/uL 5.6  5.7   Hemoglobin 13.0 - 17.0 g/dL 86.5  87.0   Hematocrit 39.0 - 52.0 % 41.2  39.8   Platelets 150 - 400 K/uL 295  301        Latest Ref Rng & Units 12/28/2023    9:55 AM 02/05/2021   10:05 AM  BMP  Glucose 70 - 99 mg/dL 84  850   BUN 6 - 20 mg/dL 13  9   Creatinine 9.38 - 1.24 mg/dL 9.19  9.09   Sodium 864 - 145 mmol/L 140  138   Potassium 3.5 - 5.1 mmol/L 4.2  3.0   Chloride 98 - 111 mmol/L 108  104   CO2 22 - 32 mmol/L 24  24   Calcium 8.9 - 10.3 mg/dL 8.7  8.5     EKG None  Radiology CT Angio Head W or Wo Contrast Result Date: 12/28/2023 CLINICAL DATA:  Headache, new onset, on and off for 2 weeks. EXAM: CT ANGIOGRAPHY HEAD TECHNIQUE: Multidetector CT imaging of the head was performed using the standard protocol during bolus administration of intravenous contrast. Multiplanar CT image reconstructions and MIPs were obtained to evaluate the vascular anatomy. RADIATION DOSE REDUCTION: This exam was performed according to the departmental dose-optimization program which includes automated exposure control, adjustment of the mA and/or kV according to patient size and/or use of iterative reconstruction technique. CONTRAST:  75mL OMNIPAQUE  IOHEXOL  350 MG/ML SOLN COMPARISON:  06/12/2014 FINDINGS: CT HEAD Brain: No evidence of acute infarction, hemorrhage, hydrocephalus, extra-axial collection or mass lesion/mass effect. Low-density in the bilateral subcortical regions is stable from 2015, suspect this is dilated perivascular spaces. Brain volume is normal. Vascular: See below Skull: Normal. Negative for fracture or focal lesion. Sinuses: High-density nodule in the right maxillary sinus, usually retention  cyst. CTA HEAD Anterior circulation: No significant stenosis, proximal occlusion, aneurysm, or vascular malformation. There is an accentuated tortuosity of vessels, bilateral ICA looping below the skull base. Posterior circulation: Vessels are smoothly contoured and diffusely patent. No branch occlusion, beading, or aneurysm Venous sinuses: Unremarkable Anatomic variants: None significant Review of the MIP images confirms the above findings. IMPRESSION: No acute finding. There is accentuated arterial tortuosity but no aneurysm, beading, or vascular malformation. Correlate for chronic hypertension. Electronically Signed   By: Dorn  Watts M.D.   On: 12/28/2023 12:44    Procedures Procedures    Medications Ordered in ED Medications  metoCLOPramide  (REGLAN ) tablet 10 mg (10 mg Oral Given 12/28/23 0912)  dexamethasone  (DECADRON ) injection 10 mg (10 mg Intravenous Given 12/28/23 0913)  iohexol  (OMNIPAQUE ) 350 MG/ML injection 75 mL (75 mLs Intravenous Contrast Given 12/28/23 1219)  ketorolac  (TORADOL ) 15 MG/ML injection 15 mg (15 mg Intravenous Given 12/28/23 1340)    ED Course/ Medical Decision Making/ A&P                                 Medical Decision Making Patient presents with a 2-week history of headache described as constant and mostly localized to the top of his scalp. Headache has been persistent despite OTC pain medication. Low concern for meningismus, infection (e.g., ear infection, URI), GCA, cluster headache, hypertensive urgency or emergency, or stroke given HPI, ROS, vital signs, and physical exam findings. Given persistence of symptoms, elevated blood pressure, and history of HTN/CAD, obtained a CT angio head to help rule out small bleed/aneurysm. Patient was given migraine cocktail including Reglan  and Decadron  for symptom relief.   BMP with serum creatinine 0.80. CBC unremarkable. CT angio imaging with no acute finding such as aneurysm, beading, or vascular malformation. Final report  noted accentuated arterial tortuosity and possible findings associated with chronic hypertension.   Upon re-evaluation, patient's headache was very minimal following migraine cocktail, agreed to one time dose of IV Toradol  for additional pain relief. Blood pressure and heart rate also improved over time, with blood pressure improving from 160s systolic at time of triage to 879d diastolic at time of discharge. Patient stable for discharge home with PCP follow up, patient agreeable to plan. Discussed indications for return to ED with patient expressing understanding.      Amount and/or Complexity of Data Reviewed External Data Reviewed: labs and radiology. Labs: ordered. Radiology: ordered.  Risk Prescription drug management.         Final Clinical Impression(s) / ED Diagnoses Final diagnoses:  Nonintractable headache, unspecified chronicity pattern, unspecified headache type    Rx / DC Orders ED Discharge Orders     None      Zackarie Chason Arellano Zameza, MD  Internal Medicine Teaching Service, PGY-1   Arellano Zameza, Richrd, MD 12/28/23 R[place]    Lenor Hollering, MD 01/05/24 2314

## 2023-12-28 NOTE — ED Triage Notes (Signed)
 Pt reports HA off and on for 2 weeks. States he had a friend recently die from an aneurysm. He states he takes excedrin with relief "but it comes right back." He says he has a headache now that started around 0300.

## 2023-12-28 NOTE — Discharge Instructions (Addendum)
 You were seen for a headache. You received pain medications and imaging was performed, which determine you do not have any acute findings concerning for aneurysm, beading, or vascular malformation. We recommend that you follow up with your primary care provider as your blood pressure was noted to be elevated and there were signs of chronic hypertension on your CT scan. Please be sure to go to your appointment with PCP on Tuesday of this week as planned.   We are glad you are feeling better with the medications that were given. Please be aware of indications to return to the ED, including a sharp/worsening headache that suddenly develops, a headache associated with confusion/changes in vision/changes in speech, or a new headache that accompanies an elevated blood pressure as these may be signs that you are experiencing a medical emergency.
